# Patient Record
Sex: Female | Born: 1945
Health system: Southern US, Community
[De-identification: ages and names within clinical notes are randomized; demographics above are authoritative.]

## PROBLEM LIST (undated history)

## (undated) DIAGNOSIS — R251 Tremor, unspecified: Secondary | ICD-10-CM

## (undated) DIAGNOSIS — D649 Anemia, unspecified: Secondary | ICD-10-CM

## (undated) DIAGNOSIS — Z973 Presence of spectacles and contact lenses: Secondary | ICD-10-CM

## (undated) DIAGNOSIS — J189 Pneumonia, unspecified organism: Secondary | ICD-10-CM

## (undated) DIAGNOSIS — H269 Unspecified cataract: Secondary | ICD-10-CM

## (undated) DIAGNOSIS — C801 Malignant (primary) neoplasm, unspecified: Secondary | ICD-10-CM

## (undated) DIAGNOSIS — M1990 Unspecified osteoarthritis, unspecified site: Secondary | ICD-10-CM

## (undated) DIAGNOSIS — F419 Anxiety disorder, unspecified: Secondary | ICD-10-CM

## (undated) DIAGNOSIS — T7840XA Allergy, unspecified, initial encounter: Secondary | ICD-10-CM

## (undated) DIAGNOSIS — C4401 Basal cell carcinoma of skin of lip: Secondary | ICD-10-CM

## (undated) HISTORY — PX: TUBAL LIGATION: SHX77

## (undated) HISTORY — DX: Anemia, unspecified: D64.9

## (undated) HISTORY — PX: DILATION AND CURETTAGE OF UTERUS: SHX78

## (undated) HISTORY — PX: SOFT TISSUE MASS EXCISION: SHX2419

## (undated) HISTORY — PX: ABDOMINAL HYSTERECTOMY: SHX81

## (undated) HISTORY — PX: APPENDECTOMY: SHX54

## (undated) HISTORY — DX: Basal cell carcinoma of skin of lip: C44.01

---

## 1999-09-05 ENCOUNTER — Encounter: Payer: Self-pay | Admitting: *Deleted

## 1999-09-05 ENCOUNTER — Encounter: Admission: RE | Admit: 1999-09-05 | Discharge: 1999-09-05 | Payer: Self-pay | Admitting: *Deleted

## 2000-09-06 ENCOUNTER — Encounter: Payer: Self-pay | Admitting: *Deleted

## 2000-09-06 ENCOUNTER — Encounter: Admission: RE | Admit: 2000-09-06 | Discharge: 2000-09-06 | Payer: Self-pay | Admitting: *Deleted

## 2001-09-20 ENCOUNTER — Encounter: Payer: Self-pay | Admitting: Internal Medicine

## 2001-09-20 ENCOUNTER — Encounter: Admission: RE | Admit: 2001-09-20 | Discharge: 2001-09-20 | Payer: Self-pay | Admitting: Internal Medicine

## 2002-11-09 ENCOUNTER — Encounter: Admission: RE | Admit: 2002-11-09 | Discharge: 2002-11-09 | Payer: Self-pay | Admitting: Internal Medicine

## 2002-11-09 ENCOUNTER — Encounter: Payer: Self-pay | Admitting: Internal Medicine

## 2003-11-30 ENCOUNTER — Encounter: Admission: RE | Admit: 2003-11-30 | Discharge: 2003-11-30 | Payer: Self-pay | Admitting: Internal Medicine

## 2004-12-11 ENCOUNTER — Encounter: Admission: RE | Admit: 2004-12-11 | Discharge: 2004-12-11 | Payer: Self-pay | Admitting: Internal Medicine

## 2005-02-27 ENCOUNTER — Encounter: Admission: RE | Admit: 2005-02-27 | Discharge: 2005-02-27 | Payer: Self-pay | Admitting: Internal Medicine

## 2005-12-18 ENCOUNTER — Encounter: Admission: RE | Admit: 2005-12-18 | Discharge: 2005-12-18 | Payer: Self-pay | Admitting: Internal Medicine

## 2007-01-18 ENCOUNTER — Encounter: Admission: RE | Admit: 2007-01-18 | Discharge: 2007-01-18 | Payer: Self-pay | Admitting: Internal Medicine

## 2008-01-20 ENCOUNTER — Encounter: Admission: RE | Admit: 2008-01-20 | Discharge: 2008-01-20 | Payer: Self-pay | Admitting: Gynecology

## 2009-01-22 ENCOUNTER — Encounter: Admission: RE | Admit: 2009-01-22 | Discharge: 2009-01-22 | Payer: Self-pay | Admitting: Internal Medicine

## 2010-01-01 ENCOUNTER — Encounter: Admission: RE | Admit: 2010-01-01 | Discharge: 2010-01-01 | Payer: Self-pay | Admitting: Internal Medicine

## 2010-01-17 ENCOUNTER — Encounter: Admission: RE | Admit: 2010-01-17 | Discharge: 2010-01-17 | Payer: Self-pay | Admitting: Internal Medicine

## 2010-02-04 ENCOUNTER — Encounter: Admission: RE | Admit: 2010-02-04 | Discharge: 2010-02-04 | Payer: Self-pay | Admitting: Internal Medicine

## 2010-02-24 ENCOUNTER — Encounter: Admission: RE | Admit: 2010-02-24 | Discharge: 2010-02-24 | Payer: Self-pay | Admitting: Internal Medicine

## 2010-04-10 ENCOUNTER — Ambulatory Visit (HOSPITAL_COMMUNITY): Admission: RE | Admit: 2010-04-10 | Discharge: 2010-04-10 | Payer: Self-pay | Admitting: Internal Medicine

## 2010-12-21 LAB — CBC
HCT: 44.6 % (ref 36.0–46.0)
MCH: 32.6 pg (ref 26.0–34.0)
MCHC: 33.9 g/dL (ref 30.0–36.0)
MCV: 96.1 fL (ref 78.0–100.0)
RBC: 4.64 MIL/uL (ref 3.87–5.11)

## 2010-12-21 LAB — CULTURE, ROUTINE-ABSCESS: Culture: NO GROWTH

## 2010-12-21 LAB — PROTIME-INR
INR: 0.9 (ref 0.00–1.49)
Prothrombin Time: 12.1 seconds (ref 11.6–15.2)

## 2010-12-21 LAB — APTT: aPTT: 26 seconds (ref 24–37)

## 2011-03-11 ENCOUNTER — Other Ambulatory Visit: Payer: Self-pay | Admitting: Internal Medicine

## 2011-03-11 DIAGNOSIS — Z1231 Encounter for screening mammogram for malignant neoplasm of breast: Secondary | ICD-10-CM

## 2011-03-24 ENCOUNTER — Ambulatory Visit
Admission: RE | Admit: 2011-03-24 | Discharge: 2011-03-24 | Disposition: A | Discharge status: Home / Self Care | Payer: Medicare Other | Source: Ambulatory Visit | Attending: Internal Medicine | Admitting: Internal Medicine

## 2011-03-24 DIAGNOSIS — Z1231 Encounter for screening mammogram for malignant neoplasm of breast: Secondary | ICD-10-CM

## 2011-09-25 ENCOUNTER — Other Ambulatory Visit: Payer: Self-pay | Admitting: Internal Medicine

## 2011-09-25 DIAGNOSIS — R3129 Other microscopic hematuria: Secondary | ICD-10-CM

## 2011-10-01 ENCOUNTER — Other Ambulatory Visit: Payer: Medicare Other

## 2011-10-08 ENCOUNTER — Ambulatory Visit
Admission: RE | Admit: 2011-10-08 | Discharge: 2011-10-08 | Disposition: A | Discharge status: Home / Self Care | Payer: Medicare Other | Source: Ambulatory Visit | Attending: Internal Medicine | Admitting: Internal Medicine

## 2011-10-08 DIAGNOSIS — R3129 Other microscopic hematuria: Secondary | ICD-10-CM

## 2012-03-22 ENCOUNTER — Other Ambulatory Visit: Payer: Self-pay | Admitting: Internal Medicine

## 2012-03-22 DIAGNOSIS — Z1231 Encounter for screening mammogram for malignant neoplasm of breast: Secondary | ICD-10-CM

## 2012-04-04 ENCOUNTER — Ambulatory Visit: Payer: Medicare Other

## 2012-04-11 ENCOUNTER — Ambulatory Visit
Admission: RE | Admit: 2012-04-11 | Discharge: 2012-04-11 | Disposition: A | Discharge status: Home / Self Care | Payer: Medicare Other | Source: Ambulatory Visit | Attending: Internal Medicine | Admitting: Internal Medicine

## 2012-04-11 DIAGNOSIS — Z1231 Encounter for screening mammogram for malignant neoplasm of breast: Secondary | ICD-10-CM

## 2012-10-05 ENCOUNTER — Observation Stay (HOSPITAL_COMMUNITY)
Admission: EM | Admit: 2012-10-05 | Discharge: 2012-10-07 | Disposition: A | Discharge status: Home / Self Care | Payer: Medicare Other | Attending: Internal Medicine | Admitting: Internal Medicine

## 2012-10-05 ENCOUNTER — Encounter (HOSPITAL_COMMUNITY): Payer: Self-pay | Admitting: *Deleted

## 2012-10-05 ENCOUNTER — Emergency Department (HOSPITAL_COMMUNITY)
Admission: EM | Admit: 2012-10-05 | Discharge: 2012-10-05 | Disposition: A | Discharge status: Nursing Home (Includes ICF) | Payer: Medicare Other | Source: Home / Self Care | Attending: Family Medicine | Admitting: Family Medicine

## 2012-10-05 ENCOUNTER — Emergency Department (INDEPENDENT_AMBULATORY_CARE_PROVIDER_SITE_OTHER): Payer: Medicare Other

## 2012-10-05 DIAGNOSIS — R509 Fever, unspecified: Secondary | ICD-10-CM | POA: Insufficient documentation

## 2012-10-05 DIAGNOSIS — J189 Pneumonia, unspecified organism: Secondary | ICD-10-CM

## 2012-10-05 DIAGNOSIS — R059 Cough, unspecified: Secondary | ICD-10-CM | POA: Insufficient documentation

## 2012-10-05 DIAGNOSIS — R0602 Shortness of breath: Secondary | ICD-10-CM | POA: Insufficient documentation

## 2012-10-05 DIAGNOSIS — IMO0001 Reserved for inherently not codable concepts without codable children: Secondary | ICD-10-CM | POA: Insufficient documentation

## 2012-10-05 DIAGNOSIS — R05 Cough: Secondary | ICD-10-CM | POA: Insufficient documentation

## 2012-10-05 HISTORY — DX: Pneumonia, unspecified organism: J18.9

## 2012-10-05 LAB — COMPREHENSIVE METABOLIC PANEL
ALT: 24 U/L (ref 0–35)
AST: 18 U/L (ref 0–37)
Albumin: 3.2 g/dL — ABNORMAL LOW (ref 3.5–5.2)
CO2: 23 mEq/L (ref 19–32)
Calcium: 9.3 mg/dL (ref 8.4–10.5)
Creatinine, Ser: 0.5 mg/dL (ref 0.50–1.10)
GFR calc non Af Amer: >90 mL/min (ref >90)
Sodium: 139 mEq/L (ref 135–145)

## 2012-10-05 LAB — CBC WITH DIFFERENTIAL/PLATELET
Basophils Absolute: 0 10*3/uL (ref 0.0–0.1)
Basophils Relative: 0 % (ref 0–1)
Eosinophils Relative: 0 % (ref 0–5)
Lymphocytes Relative: 10 % — ABNORMAL LOW (ref 12–46)
MCHC: 34 g/dL (ref 30.0–36.0)
MCV: 91.2 fL (ref 78.0–100.0)
Neutro Abs: 5.3 10*3/uL (ref 1.7–7.7)
Platelets: 248 10*3/uL (ref 150–400)
RDW: 13.3 % (ref 11.5–15.5)
WBC: 6.4 10*3/uL (ref 4.0–10.5)

## 2012-10-05 MED ORDER — ONDANSETRON 4 MG PO TBDP
ORAL_TABLET | ORAL | Status: AC
  Filled 2012-10-05: qty 1

## 2012-10-05 MED ORDER — ZOLPIDEM TARTRATE 5 MG PO TABS
5.0000 mg | ORAL_TABLET | Freq: Every evening | ORAL | Status: DC | PRN
  Administered 2012-10-05 – 2012-10-06 (×2): 5 mg via ORAL
  Filled 2012-10-05 (×2): qty 1

## 2012-10-05 MED ORDER — ACETAMINOPHEN 325 MG PO TABS
ORAL_TABLET | ORAL | Status: AC
  Filled 2012-10-05: qty 2

## 2012-10-05 MED ORDER — ONDANSETRON HCL 4 MG PO TABS: 4.0000 mg | ORAL_TABLET | Freq: Four times a day (QID) | ORAL | Status: DC | PRN

## 2012-10-05 MED ORDER — BENZONATATE 100 MG PO CAPS
100.0000 mg | ORAL_CAPSULE | Freq: Three times a day (TID) | ORAL | Status: DC | PRN
  Administered 2012-10-05 – 2012-10-07 (×4): 100 mg via ORAL
  Filled 2012-10-05 (×4): qty 1

## 2012-10-05 MED ORDER — ONDANSETRON HCL 4 MG/2ML IJ SOLN: 4.0000 mg | Freq: Three times a day (TID) | INTRAMUSCULAR | Status: AC | PRN

## 2012-10-05 MED ORDER — ACETAMINOPHEN 325 MG PO TABS
ORAL_TABLET | ORAL | Status: AC
  Filled 2012-10-05: qty 1

## 2012-10-05 MED ORDER — ACETAMINOPHEN 325 MG PO TABS
650.0000 mg | ORAL_TABLET | Freq: Four times a day (QID) | ORAL | Status: DC | PRN
  Administered 2012-10-05 – 2012-10-07 (×5): 650 mg via ORAL
  Filled 2012-10-05 (×5): qty 2

## 2012-10-05 MED ORDER — ENOXAPARIN SODIUM 40 MG/0.4ML ~~LOC~~ SOLN
40.0000 mg | SUBCUTANEOUS | Status: DC
  Filled 2012-10-05 (×3): qty 0.4

## 2012-10-05 MED ORDER — DEXTROSE 5 % IV SOLN
500.0000 mg | Freq: Once | INTRAVENOUS | Status: DC
  Filled 2012-10-05: qty 500

## 2012-10-05 MED ORDER — SODIUM CHLORIDE 0.9 % IV SOLN
INTRAVENOUS | Status: AC
  Administered 2012-10-05: 16:00:00 via INTRAVENOUS

## 2012-10-05 MED ORDER — ONDANSETRON HCL 4 MG/2ML IJ SOLN
4.0000 mg | Freq: Four times a day (QID) | INTRAMUSCULAR | Status: DC | PRN
  Administered 2012-10-06: 4 mg via INTRAVENOUS
  Filled 2012-10-05: qty 2

## 2012-10-05 MED ORDER — ACETAMINOPHEN 650 MG RE SUPP: 650.0000 mg | Freq: Four times a day (QID) | RECTAL | Status: DC | PRN

## 2012-10-05 MED ORDER — ONDANSETRON 4 MG PO TBDP
4.0000 mg | ORAL_TABLET | Freq: Once | ORAL | Status: AC
Start: 2012-10-05 — End: 2012-10-05
  Administered 2012-10-05: 4 mg via ORAL

## 2012-10-05 MED ORDER — DEXTROSE 5 % IV SOLN
1.0000 g | INTRAVENOUS | Status: DC
  Administered 2012-10-06: 1 g via INTRAVENOUS
  Filled 2012-10-05: qty 10

## 2012-10-05 MED ORDER — DEXTROSE 5 % IV SOLN
1.0000 g | Freq: Once | INTRAVENOUS | Status: AC
  Administered 2012-10-05: 1 g via INTRAVENOUS
  Filled 2012-10-05: qty 10

## 2012-10-05 MED ORDER — ALUM & MAG HYDROXIDE-SIMETH 200-200-20 MG/5ML PO SUSP: 30.0000 mL | Freq: Four times a day (QID) | ORAL | Status: DC | PRN

## 2012-10-05 MED ORDER — AZITHROMYCIN 250 MG PO TABS
250.0000 mg | ORAL_TABLET | Freq: Every day | ORAL | Status: DC
  Administered 2012-10-06: 250 mg via ORAL
  Filled 2012-10-05 (×2): qty 1

## 2012-10-05 MED ORDER — SODIUM CHLORIDE 0.9 % IV SOLN: INTRAVENOUS | Status: DC

## 2012-10-05 MED ORDER — ALBUTEROL SULFATE (5 MG/ML) 0.5% IN NEBU: 2.5000 mg | INHALATION_SOLUTION | RESPIRATORY_TRACT | Status: AC | PRN

## 2012-10-05 MED ORDER — ACETAMINOPHEN 500 MG PO TABS
1000.0000 mg | ORAL_TABLET | Freq: Four times a day (QID) | ORAL | Status: DC | PRN
  Administered 2012-10-05: 1000 mg via ORAL

## 2012-10-05 NOTE — ED Notes (Signed)
Results of lactic acid shown to Dr. Radford Pax

## 2012-10-05 NOTE — H&P (Signed)
Physician Admission History and Physical     PCP:   Gwen Pounds, MD   Chief Complaint:  Pneumonia   HPI: Tiffany Franklin is an 67 y.o. female.  Pt presents after having malaise since Christmas and then developing chills on Friday. She is having a cough. Feels like she cant take a deep breath. Did have 1 episode of nausea and emesis yesterday, but possibly from the NSAIDs she has been taking for her pain/discomfort. Found to have PNA on CXR. Being admitted for  antibiotics and fluids. CURB65 was 2, so int risk of complication from the PNA.  Review of Systems:  ROS negative except as noted above.   Past Medical History: Past Medical History  Diagnosis Date  . Pneumonia    Past Surgical History  Procedure Date  . Abdominal hysterectomy     Medications: Prior to Admission medications   Medication Sig Start Date End Date Taking? Authorizing Provider  ALPRAZolam Prudy Feeler) 0.5 MG tablet Take 0.5 mg by mouth at bedtime as needed.   Yes Historical Provider, MD  Ascorbic Acid (VITAMIN C) 100 MG tablet Take 100 mg by mouth daily.   Yes Historical Provider, MD  estradiol (VAGIFEM) 25 MCG vaginal tablet Place 25 mcg vaginally daily.   Yes Historical Provider, MD    Allergies:   Allergies  Allergen Reactions  . Demerol (Meperidine) Nausea And Vomiting and Other (See Comments)    Blood pressure becomes elevated.    Social History:  reports that she has never smoked. She does not have any smokeless tobacco history on file. She reports that she drinks alcohol. Her drug history not on file.  Family History: No family history on file.  Physical Exam: Filed Vitals:   10/05/12 1343 10/05/12 1430 10/05/12 1530  BP: 130/65 114/47 109/53  Pulse: 123 87 89  Temp:  99.3 F (37.4 C)   TempSrc:  Oral   Resp: 24 16 19   SpO2: 95% 95% 95%   General appearance: WF in NAD  Head: Normocephalic, without obvious abnormality, atraumatic Eyes: conjunctivae/corneas clear. PERRL, EOM's intact.   Nose: Nares normal. Septum midline. Mucosa normal. No drainage or sinus tenderness. Throat: lips, mucosa, and tongue normal; teeth and gums normal Neck: no adenopathy, no carotid bruit, no JVD and thyroid not enlarged, symmetric, no tenderness/mass/nodules Resp: good air movement throughout. Rhonchi in the bases.  Cardio: PVCs present, but otherwise RRR and no MRG  GI: soft, non-tender; bowel sounds normal; no masses,  no organomegaly Extremities: extremities normal, atraumatic, no cyanosis or edema Pulses: 2+ and symmetric Lymph nodes: Cervical adenopathy: no cervical lymphadenopathy Neurologic: Alert and oriented X 3, normal strength and tone. Normal symmetric reflexes.     Labs on Admission:   [facility] 10/05/12 1453  NA 139  K 3.5  CL 102  CO2 23  GLUCOSE 122*  BUN 11  CREATININE 0.50  CALCIUM 9.3  MG --  PHOS --    Basename 10/05/12 1453  AST 18  ALT 24  ALKPHOS 157*  BILITOT 0.5  PROT 7.2  ALBUMIN 3.2*   No results found for this basename: LIPASE:2,AMYLASE:2 in the last 72 hours  Basename 10/05/12 1453  WBC 6.4  NEUTROABS 5.3  HGB 12.0  HCT 35.3*  MCV 91.2  PLT 248   No results found for this basename: CKTOTAL:3,CKMB:3,CKMBINDEX:3,TROPONINI:3 in the last 72 hours Lab Results  Component Value Date   INR 0.90 04/10/2010   No results found for this basename: TSH,T4TOTAL,FREET3,T3FREE,THYROIDAB in the last 72 hours  No results found for this basename: VITAMINB12:2,FOLATE:2,FERRITIN:2,TIBC:2,IRON:2,RETICCTPCT:2 in the last 72 hours  Radiological Exams on Admission: Dg Chest 2 View  10/05/2012  *RADIOLOGY REPORT*  Clinical Data: Fever and cough.  Hypoxia.  Shortness of breath.  CHEST - 2 VIEW  Comparison: None.  Findings: Airspace disease is seen bilaterally in the right upper lobe and left lower lung, suspicious for pneumonia.  No evidence of pleural effusion.  Heart size is normal.  No mass or lymphadenopathy identified.  IMPRESSION: Bilateral right upper lobe  and left lower lung airspace disease, consistent with pneumonia. Post-treatment  radiographic followup recommended to confirm resolution.   Original Report Authenticated By: Myles Rosenthal, M.D.    No orders found for this or any previous visit.  Assessment/Plan Principal Problem:  *Pneumonia  - CAP: Azithromycin and rocephin for the first 24-48hr and then if improvement likely can be discharged home on Thurs-Fri w/ avelox.. Blood cultures are pending. NS at 100cc/hr overnight, then likely can d/c in AM and allow oral intake only. She is afebrile w/o leukocytosis. zofran prn. Tessalon prn. Ativan prn.   - elevated alk phos w/ normal other LFTs: likely benign. Will check GGT to see if liver in origin. No elevated calcium. Albumin is low. No RUQ pain.   Admit to OBS. Lovenox for DVT ppx. Normal diet.   Floyd Lusignan 10/05/2012, 3:50 PM

## 2012-10-05 NOTE — ED Provider Notes (Signed)
History     CSN: [id number]  Arrival date & time 10/05/12  1154   First MD Initiated Contact with Patient 10/05/12 1236      Chief Complaint  Patient presents with  . Fever    (Consider location/radiation/quality/duration/timing/severity/associated sxs/prior treatment) Patient is a 67 y.o. female presenting with fever. The history is provided by the patient.  Fever Primary symptoms of the febrile illness include fever, fatigue, cough and shortness of breath. Primary symptoms do not include nausea, vomiting, diarrhea or dysuria. The current episode started 6 to 7 days ago. This is a new problem. The problem has been gradually worsening.    History reviewed. No pertinent past medical history.  Past Surgical History  Procedure Date  . Abdominal hysterectomy     No family history on file.  History  Substance Use Topics  . Smoking status: Never Smoker   . Smokeless tobacco: Not on file  . Alcohol Use: No    OB History    Grav Para Term Preterm Abortions TAB SAB Ect Mult Living                  Review of Systems  Constitutional: Positive for fever, chills and fatigue.  Respiratory: Positive for cough and shortness of breath.   Gastrointestinal: Negative for nausea, vomiting and diarrhea.  Genitourinary: Negative for dysuria.    Allergies  Review of patient's allergies indicates no known allergies.  Home Medications  No current outpatient prescriptions on file.  BP 124/71  Pulse 120  Temp 102.2 F (39 C) (Oral)  Resp 20  SpO2 96%  Physical Exam  Nursing note and vitals reviewed. Constitutional: She is oriented to person, place, and time. She appears well-developed and well-nourished. She appears distressed.  HENT:  Head: Normocephalic.  Right Ear: External ear normal.  Left Ear: External ear normal.  Mouth/Throat: Oropharynx is clear and moist.  Eyes: Pupils are equal, round, and reactive to light.  Neck: Normal range of motion. Neck supple.    Cardiovascular: Normal rate, normal heart sounds and intact distal pulses.   Pulmonary/Chest: She is in respiratory distress. She has rales.  Lymphadenopathy:    She has no cervical adenopathy.  Neurological: She is alert and oriented to person, place, and time.  Skin: Skin is warm and dry.    ED Course  Procedures (including critical care time)  Labs Reviewed - No data to display No results found.   1. CAP (community acquired pneumonia)       MDM  X-rays reviewed and report per radiologist.         Linna Hoff, MD 10/05/12 1308

## 2012-10-05 NOTE — ED Notes (Signed)
Pt sent from ucc with fever, cough with green sputum, sob.  Diagnosed with bilateral pna at urgent care.  Treated for temp 102 with tylenol and zofran for nausea

## 2012-10-05 NOTE — ED Provider Notes (Signed)
History     CSN: [id number]  Arrival date & time 10/05/12  1333   First MD Initiated Contact with Patient 10/05/12 1236      No chief complaint on file.   (Consider location/radiation/quality/duration/timing/severity/associated sxs/prior treatment) Patient is a 67 y.o. female presenting with fever. The history is provided by the patient.  Fever Primary symptoms of the febrile illness include fever, fatigue, cough, shortness of breath and myalgias. Primary symptoms do not include nausea, vomiting, diarrhea, dysuria, altered mental status, arthralgias or rash. The current episode started 6 to 7 days ago. This is a new problem. The problem has been gradually worsening.    Past Medical History  Diagnosis Date  . Pneumonia     Past Surgical History  Procedure Date  . Abdominal hysterectomy     No family history on file.  History  Substance Use Topics  . Smoking status: Never Smoker   . Smokeless tobacco: Not on file  . Alcohol Use: Yes     Comment: occ    OB History    Grav Para Term Preterm Abortions TAB SAB Ect Mult Living                  Review of Systems  Constitutional: Positive for fever, chills and fatigue.  Respiratory: Positive for cough and shortness of breath.   Gastrointestinal: Negative for nausea, vomiting and diarrhea.  Genitourinary: Negative for dysuria.  Musculoskeletal: Positive for myalgias. Negative for arthralgias.  Skin: Negative for rash.  Psychiatric/Behavioral: Negative for altered mental status.    Allergies  Demerol  Home Medications   Current Outpatient Rx  Name  Route  Sig  Dispense  Refill  . ALPRAZOLAM 0.5 MG PO TABS   Oral   Take 0.5 mg by mouth at bedtime as needed.         Marland Kitchen VITAMIN C 100 MG PO TABS   Oral   Take 100 mg by mouth daily.         Marland Kitchen ESTRADIOL 25 MCG VA TABS   Vaginal   Place 25 mcg vaginally daily.           BP 109/53  Pulse 89  Temp 99.3 F (37.4 C) (Oral)  Resp 19  SpO2 95%  Physical  Exam  Nursing note and vitals reviewed. Constitutional: She is oriented to person, place, and time. She appears well-developed and well-nourished. She appears distressed.  HENT:  Head: Normocephalic.  Right Ear: External ear normal.  Left Ear: External ear normal.  Mouth/Throat: Oropharynx is clear and moist.  Eyes: Pupils are equal, round, and reactive to light.  Neck: Normal range of motion. Neck supple.  Cardiovascular: Normal rate, normal heart sounds and intact distal pulses.   Pulmonary/Chest: She is in respiratory distress. She has wheezes.  Abdominal: She exhibits no distension. There is no tenderness. There is no rebound.  Musculoskeletal: Normal range of motion.  Lymphadenopathy:    She has no cervical adenopathy.  Neurological: She is alert and oriented to person, place, and time.  Skin: Skin is warm and dry.    ED Course  Procedures  (including critical care time)   Labs Reviewed  CBC WITH DIFFERENTIAL - Abnormal; Notable for the following:    HCT 35.3 (*)     Neutrophils Relative 83 (*)     Lymphocytes Relative 10 (*)     Lymphs Abs 0.6 (*)     All other components within normal limits  COMPREHENSIVE METABOLIC PANEL - Abnormal; Notable  for the following:    Glucose, Bld 122 (*)     Albumin 3.2 (*)     Alkaline Phosphatase 157 (*)     All other components within normal limits  CG4 I-STAT (LACTIC ACID)  CULTURE, BLOOD (ROUTINE X 2)  CULTURE, BLOOD (ROUTINE X 2)   Dg Chest 2 View  10/05/2012  *RADIOLOGY REPORT*  Clinical Data: Fever and cough.  Hypoxia.  Shortness of breath.  CHEST - 2 VIEW  Comparison: None.  Findings: Airspace disease is seen bilaterally in the right upper lobe and left lower lung, suspicious for pneumonia.  No evidence of pleural effusion.  Heart size is normal.  No mass or lymphadenopathy identified.  IMPRESSION: Bilateral right upper lobe and left lower lung airspace disease, consistent with pneumonia. Post-treatment  radiographic followup  recommended to confirm resolution.   Original Report Authenticated By: Myles Rosenthal, M.D.      1. Community acquired pneumonia       MDM  X-rays reviewed and report per radiologist.          Nelia Shi, MD 10/05/12 [id number]

## 2012-10-05 NOTE — ED Notes (Signed)
Pt  Reports  A   Productive   Cough    With  Chills         And  Fever       X   A  Few  Days     Pt  denys  Any  Urinary  Symptoms

## 2012-10-06 LAB — COMPREHENSIVE METABOLIC PANEL
ALT: 17 U/L (ref 0–35)
Alkaline Phosphatase: 131 U/L — ABNORMAL HIGH (ref 39–117)
BUN: 9 mg/dL (ref 6–23)
CO2: 25 mEq/L (ref 19–32)
Chloride: 104 mEq/L (ref 96–112)
GFR calc Af Amer: >90 mL/min (ref >90)
GFR calc non Af Amer: >90 mL/min (ref >90)
Glucose, Bld: 95 mg/dL (ref 70–99)
Potassium: 3.6 mEq/L (ref 3.5–5.1)
Sodium: 142 mEq/L (ref 135–145)
Total Bilirubin: 0.4 mg/dL (ref 0.3–1.2)
Total Protein: 6.6 g/dL (ref 6.0–8.3)

## 2012-10-06 LAB — CBC
HCT: 34.1 % — ABNORMAL LOW (ref 36.0–46.0)
MCHC: 34 g/dL (ref 30.0–36.0)
Platelets: 251 10*3/uL (ref 150–400)
RDW: 13.5 % (ref 11.5–15.5)
WBC: 6.5 10*3/uL (ref 4.0–10.5)

## 2012-10-06 MED ORDER — PSEUDOEPHEDRINE HCL ER 120 MG PO TB12
120.0000 mg | ORAL_TABLET | Freq: Two times a day (BID) | ORAL | Status: DC | PRN
  Administered 2012-10-06 – 2012-10-07 (×2): 120 mg via ORAL
  Filled 2012-10-06 (×2): qty 1

## 2012-10-06 MED ORDER — PROMETHAZINE-PHENYLEPHRINE 6.25-5 MG/5ML PO SYRP
5.0000 mL | ORAL_SOLUTION | ORAL | Status: DC | PRN
  Filled 2012-10-06: qty 5

## 2012-10-06 MED ORDER — DM-GUAIFENESIN ER 30-600 MG PO TB12
1.0000 | ORAL_TABLET | Freq: Two times a day (BID) | ORAL | Status: DC | PRN
  Administered 2012-10-06 – 2012-10-07 (×2): 1 via ORAL
  Filled 2012-10-06 (×2): qty 1

## 2012-10-06 NOTE — Progress Notes (Signed)
Subjective: Feels ok today.  Just lots of cough and congestion, feels tired.  Did walk in halls with the nurse last night.  Objective: Vital signs in last 24 hours: Temp:  [97.9 F (36.6 C)-102.2 F (39 C)] 97.9 F (36.6 C) (01/02 0535) Pulse Rate:  [77-123] 77  (01/02 0535) Resp:  [16-24] 20  (01/02 0535) BP: (101-130)/(47-71) 114/57 mmHg (01/02 0535) SpO2:  [95 %-100 %] 96 % (01/02 0535) Weight:  [63.504 kg (140 lb)] 63.504 kg (140 lb) (01/01 1900) Weight change:  Last BM Date: 10/04/12  Intake/Output from previous day:   Intake/Output this shift:   General appearance: WF in NAD  Nose: Nares normal. Septum midline.some clear drainage Throat: lips, mucosa, and tongue normal; teeth and gums normal  Neck: no adenopathy, no carotid bruit, no JVD and thyroid not enlarged, symmetric, no tenderness/mass/nodules  Resp: good air movement throughout. Rhonchi in the bases.  Cardio:  RRR and no murmur  GI: soft, non-tender; bowel sounds normal; no masses, no organomegaly  Extremities: extremities normal, atraumatic, no cyanosis or edema  Pulses: 2+ and symmetric    Lab Results:  Basename 10/05/12 1453  WBC 6.4  HGB 12.0  HCT 35.3*  PLT 248   BMET  Basename 10/05/12 1453  NA 139  K 3.5  CL 102  CO2 23  GLUCOSE 122*  BUN 11  CREATININE 0.50  CALCIUM 9.3    Studies/Results: Dg Chest 2 View  10/05/2012  *RADIOLOGY REPORT*  Clinical Data: Fever and cough.  Hypoxia.  Shortness of breath.  CHEST - 2 VIEW  Comparison: None.  Findings: Airspace disease is seen bilaterally in the right upper lobe and left lower lung, suspicious for pneumonia.  No evidence of pleural effusion.  Heart size is normal.  No mass or lymphadenopathy identified.  IMPRESSION: Bilateral right upper lobe and left lower lung airspace disease, consistent with pneumonia. Post-treatment  radiographic followup recommended to confirm resolution.   Original Report Authenticated By: Myles Rosenthal, M.D.      Medications:  I have reviewed the patient's current medications. Scheduled:   . azithromycin  250 mg Oral q1800  . cefTRIAXone (ROCEPHIN)  IV  1 g Intravenous Q24H  . enoxaparin (LOVENOX) injection  40 mg Subcutaneous Q24H   Continuous:  XBJ:[id number], acetaminophen, alum & mag hydroxide-simeth, benzonatate, ondansetron (ZOFRAN) IV, ondansetron, promethazine-phenylephrine, zolpidem  Assessment/Plan: Principal Problem:  *Pneumonia  - CAP: Azithromycin and rocephin for the first 24-48hr and then if improvement likely can be discharged home on Thurs-Fri w/ Zithromax and Ceftin to complete course,.   Blood cultures are pending. Phenylephrine/promethazine for congestion and cough. Continue PO fluids- she is to drink 1 glass of water every hour until her urine is clear and then drink for thirst. Likely home in AM if remains afebrile and no worsening in respiratory status.  LOS: 1 day   Tiffany Franklin W 10/06/2012, 8:21 AM

## 2012-10-07 ENCOUNTER — Observation Stay (HOSPITAL_COMMUNITY): Payer: Medicare Other

## 2012-10-07 LAB — COMPREHENSIVE METABOLIC PANEL
ALT: 18 U/L (ref 0–35)
Albumin: 3.1 g/dL — ABNORMAL LOW (ref 3.5–5.2)
Alkaline Phosphatase: 140 U/L — ABNORMAL HIGH (ref 39–117)
Glucose, Bld: 101 mg/dL — ABNORMAL HIGH (ref 70–99)
Potassium: 3.1 mEq/L — ABNORMAL LOW (ref 3.5–5.1)
Sodium: 140 mEq/L (ref 135–145)
Total Protein: 7.4 g/dL (ref 6.0–8.3)

## 2012-10-07 LAB — CBC
HCT: 37.1 % (ref 36.0–46.0)
MCH: 31.4 pg (ref 26.0–34.0)
MCHC: 34.5 g/dL (ref 30.0–36.0)
MCV: 90.9 fL (ref 78.0–100.0)
RDW: 13 % (ref 11.5–15.5)

## 2012-10-07 MED ORDER — ACETAMINOPHEN 325 MG PO TABS: 650.0000 mg | ORAL_TABLET | Freq: Four times a day (QID) | ORAL | Status: DC | PRN

## 2012-10-07 MED ORDER — ALIGN 4 MG PO CAPS: 4.0000 mg | ORAL_CAPSULE | Freq: Every day | ORAL | Status: DC

## 2012-10-07 MED ORDER — POTASSIUM CHLORIDE CRYS ER 20 MEQ PO TBCR
40.0000 meq | EXTENDED_RELEASE_TABLET | Freq: Once | ORAL | Status: AC
  Administered 2012-10-07: 40 meq via ORAL
  Filled 2012-10-07: qty 2

## 2012-10-07 MED ORDER — BENZONATATE 100 MG PO CAPS: 100.0000 mg | ORAL_CAPSULE | Freq: Three times a day (TID) | ORAL | Status: DC | PRN

## 2012-10-07 MED ORDER — FLUTICASONE-SALMETEROL 250-50 MCG/DOSE IN AEPB: 1.0000 | INHALATION_SPRAY | Freq: Two times a day (BID) | RESPIRATORY_TRACT | Status: DC

## 2012-10-07 MED ORDER — DM-GUAIFENESIN ER 30-600 MG PO TB12: 1.0000 | ORAL_TABLET | Freq: Two times a day (BID) | ORAL | Status: DC | PRN

## 2012-10-07 MED ORDER — IPRATROPIUM BROMIDE 0.02 % IN SOLN
0.5000 mg | Freq: Once | RESPIRATORY_TRACT | Status: AC
  Administered 2012-10-07: 0.5 mg via RESPIRATORY_TRACT
  Filled 2012-10-07: qty 2.5

## 2012-10-07 MED ORDER — LEVOFLOXACIN 750 MG PO TABS: 750.0000 mg | ORAL_TABLET | ORAL | Status: DC

## 2012-10-07 MED ORDER — LEVOFLOXACIN 750 MG PO TABS
750.0000 mg | ORAL_TABLET | ORAL | Status: DC
  Administered 2012-10-07: 750 mg via ORAL
  Filled 2012-10-07 (×2): qty 1

## 2012-10-07 MED ORDER — PSEUDOEPHEDRINE HCL ER 120 MG PO TB12: 120.0000 mg | ORAL_TABLET | Freq: Two times a day (BID) | ORAL | Status: DC | PRN

## 2012-10-07 NOTE — Discharge Summary (Signed)
Physician Discharge Summary  DISCHARGE SUMMARY   Patient ID: Tiffany Franklin MR#: [id number] DOB/AGE: Sep 22, 1946 67 y.o.   Attending Physician:Sasan Wilkie M  Patient's WUJ:WJXBJ,YNWG M, MD  Consults: none  Admit date: 10/05/2012 Discharge date: 10/07/2012  Discharge Diagnoses:  Principal Problem:  *Pneumonia   Patient Active Problem List  Diagnosis  . Pneumonia   Past Medical History  Diagnosis Date  . Pneumonia     Discharged Condition: stable   Discharge Medications:   Medication List     As of 10/07/2012 12:50 PM    TAKE these medications         acetaminophen 325 MG tablet   Commonly known as: TYLENOL   Take 2 tablets (650 mg total) by mouth every 6 (six) hours as needed (or Fever >/= 101).      ALIGN 4 MG Caps   Take 4 mg by mouth daily.      ALPRAZolam 0.5 MG tablet   Commonly known as: XANAX   Take 0.5 mg by mouth at bedtime as needed.      benzonatate 100 MG capsule   Commonly known as: TESSALON   Take 1 capsule (100 mg total) by mouth 3 (three) times daily as needed for cough.      dextromethorphan-guaiFENesin 30-600 MG per 12 hr tablet   Commonly known as: MUCINEX DM   Take 1 tablet by mouth 2 (two) times daily as needed.      estradiol 25 MCG vaginal tablet   Commonly known as: VAGIFEM   Place 25 mcg vaginally daily.      Fluticasone-Salmeterol 250-50 MCG/DOSE Aepb   Commonly known as: ADVAIR   Inhale 1 puff into the lungs 2 (two) times daily.      levofloxacin 750 MG tablet   Commonly known as: LEVAQUIN   Take 1 tablet (750 mg total) by mouth daily.      pseudoephedrine 120 MG 12 hr tablet   Commonly known as: SUDAFED   Take 1 tablet (120 mg total) by mouth every 12 (twelve) hours as needed.      vitamin C 100 MG tablet   Take 100 mg by mouth daily.         Hospital Procedures: Dg Chest 2 View  10/05/2012  *RADIOLOGY REPORT*  Clinical Data: Fever and cough.  Hypoxia.  Shortness of breath.  CHEST - 2 VIEW  Comparison: None.   Findings: Airspace disease is seen bilaterally in the right upper lobe and left lower lung, suspicious for pneumonia.  No evidence of pleural effusion.  Heart size is normal.  No mass or lymphadenopathy identified.  IMPRESSION: Bilateral right upper lobe and left lower lung airspace disease, consistent with pneumonia. Post-treatment  radiographic followup recommended to confirm resolution.   Original Report Authenticated By: Myles Rosenthal, M.D.     History of Present Illness: 67 y.o. Female presented to med attention 10/05/12 after having malaise since Christmas and then developing chills on Friday. She was admitted with cough congestion, chills and SOB.  She did have 1 episode of nausea and emesis. In the ED she was found  to have PNA on CXR. She was admitted for antibiotics and fluids.    Hospital Course: She was admitted 10/05/12 with Bilateral CAP. She was treated with IV Fluids, Oral Sx relief meds, and IV Abx She improved over the two days she was here. She transitioned to oral Abx on 10/07/12 She was feeling much better than D/c. Her Labs were fine. CXR still c/w  PNA. Sats are fine. I will add more pulmonary toilet. She is less SOB Still coughing and getting stuff up. No wheezing to currently justify Pred taper.  She was seen on am rounds 10/07/12 and her Azithromycin and rocephin were switched to PO levaquin. Will attempt a little more pulmonary toilet, D/Ced PIV and waited to D/C home post lunch.  I reviewed CXR with her.  Blood cultures NGTD.  She has been up walking.  She states there is nothing being done in hospital that she cannot do at home on her own.  Phenylephrine/promethazine for congestion and cough.  She remains afebrile and no worsening in respiratory status.  S/P replacement of K.  Alk Phos was high.  GGT was Normal.  Will follow as outpt as level was 131 - 157  The Breathing treatment may have helped a little at best. She feels well enough for D/c She had a fatigue sinking  spell @ 10 am and now better.    Day of Discharge Exam BP 117/67  Pulse 98  Temp 98 F (36.7 C) (Oral)  Resp 20  Ht 5\' 6"  (1.676 m)  Wt 63.504 kg (140 lb)  BMI 22.60 kg/m2  SpO2 96%  PE - See PN today  Discharge Labs:  [facility] 10/07/12 0650 10/06/12 0746  NA 140 142  K 3.1* 3.6  CL 100 104  CO2 24 25  GLUCOSE 101* 95  BUN 9 9  CREATININE 0.44* 0.47*  CALCIUM 9.5 9.1  MG -- --  PHOS -- --    Basename 10/07/12 0650 10/06/12 0746  AST 17 13  ALT 18 17  ALKPHOS 140* 131*  BILITOT 0.3 0.4  PROT 7.4 6.6  ALBUMIN 3.1* 2.8*    Basename 10/07/12 0650 10/06/12 0746 10/05/12 1453  WBC 9.1 6.5 --  NEUTROABS -- -- 5.3  HGB 12.8 11.6* --  HCT 37.1 34.1* --  MCV 90.9 91.2 --  PLT 292 251 --   No results found for this basename: CKTOTAL:3,CKMB:3,CKMBINDEX:3,TROPONINI:3 in the last 72 hours No results found for this basename: TSH,T4TOTAL,FREET3,T3FREE,THYROIDAB in the last 72 hours No results found for this basename: VITAMINB12:2,FOLATE:2,FERRITIN:2,TIBC:2,IRON:2,RETICCTPCT:2 in the last 72 hours Lab Results  Component Value Date   INR 0.90 04/10/2010       Discharge instructions:  04-Intermediate Care Facility Follow-up Information    Follow up with Gwen Pounds, MD. In 7 days.   Contact information:   [address] [facility] [place] [address] [phone]           Disposition: home  Follow-up Appts: Follow-up with Dr. Timothy Lasso at [facility] in 1 week.  Call for appointment.  Condition on Discharge: stable  Tests Needing Follow-up: CXR and labs  Time spent in discharge (includes decision making & examination of pt): 30 min  Signed: Michaelle Bottomley M 10/07/2012, 12:50 PM

## 2012-10-07 NOTE — Progress Notes (Signed)
Subjective: Feels ok today.  Still coughing and congested. Sitting in chair. Feeling better. Tired.   No more chills. Sweating but no fever.  Objective: Vital signs in last 24 hours: Temp:  [97.6 F (36.4 C)-98 F (36.7 C)] 98 F (36.7 C) (01/03 0534) Pulse Rate:  [76-98] 98  (01/03 0534) Resp:  [18-20] 20  (01/03 0534) BP: (117-135)/(61-67) 117/67 mmHg (01/03 0534) SpO2:  [95 %-96 %] 96 % (01/03 0534) Weight change:  Last BM Date: 10/04/12   General appearance: WF in NAD  Nose: Nares normal. Septum midline.some clear drainage Throat: lips, mucosa, and tongue normal; teeth and gums normal  Neck: no adenopathy, no carotid bruit, no JVD and thyroid not enlarged, symmetric, no tenderness/mass/nodules  Resp: good air movement throughout. Rhonchi in the bases.  Cardio:  RRR and no murmur  GI: soft, non-tender; bowel sounds normal; no masses, no organomegaly  Extremities: extremities normal, atraumatic, no cyanosis or edema  Pulses: 2+ and symmetric    Lab Results:  Basename 10/07/12 0650 10/06/12 0746  WBC 9.1 6.5  HGB 12.8 11.6*  HCT 37.1 34.1*  PLT 292 251   BMET  Basename 10/06/12 0746 10/05/12 1453  NA 142 139  K 3.6 3.5  CL 104 102  CO2 25 23  GLUCOSE 95 122*  BUN 9 11  CREATININE 0.47* 0.50  CALCIUM 9.1 9.3    Studies/Results: Dg Chest 2 View  10/05/2012  *RADIOLOGY REPORT*  Clinical Data: Fever and cough.  Hypoxia.  Shortness of breath.  CHEST - 2 VIEW  Comparison: None.  Findings: Airspace disease is seen bilaterally in the right upper lobe and left lower lung, suspicious for pneumonia.  No evidence of pleural effusion.  Heart size is normal.  No mass or lymphadenopathy identified.  IMPRESSION: Bilateral right upper lobe and left lower lung airspace disease, consistent with pneumonia. Post-treatment  radiographic followup recommended to confirm resolution.   Original Report Authenticated By: Myles Rosenthal, M.D.     Medications:  I have reviewed the patient's  current medications.   Assessment/Plan: Principal Problem:  *Pneumonia  - CAP: Azithromycin and rocephin will be D/ced and switched to levaquin.  Will attempt a little more pulmonary toilet. D/C PIV. Anticipate home post lunch Labs Normal. I reviewed CXR with her. Blood cultures NGTD Phenylephrine/promethazine for congestion and cough. She remains afebrile and no worsening in respiratory status.  LOS: 2 days   Timarie Labell M 10/07/2012, 7:35 AM

## 2012-10-11 LAB — CULTURE, BLOOD (ROUTINE X 2)

## 2013-04-24 ENCOUNTER — Other Ambulatory Visit: Payer: Self-pay

## 2013-04-24 DIAGNOSIS — Z1231 Encounter for screening mammogram for malignant neoplasm of breast: Secondary | ICD-10-CM

## 2013-05-18 ENCOUNTER — Ambulatory Visit
Admission: RE | Admit: 2013-05-18 | Discharge: 2013-05-18 | Disposition: A | Discharge status: Home / Self Care | Payer: Medicare Other | Source: Ambulatory Visit

## 2013-05-18 DIAGNOSIS — Z1231 Encounter for screening mammogram for malignant neoplasm of breast: Secondary | ICD-10-CM

## 2014-09-18 ENCOUNTER — Other Ambulatory Visit: Payer: Self-pay

## 2014-09-18 DIAGNOSIS — Z1231 Encounter for screening mammogram for malignant neoplasm of breast: Secondary | ICD-10-CM

## 2014-10-09 ENCOUNTER — Ambulatory Visit
Admission: RE | Admit: 2014-10-09 | Discharge: 2014-10-09 | Disposition: A | Discharge status: Home / Self Care | Payer: PRIVATE HEALTH INSURANCE | Source: Ambulatory Visit

## 2014-10-09 DIAGNOSIS — Z1231 Encounter for screening mammogram for malignant neoplasm of breast: Secondary | ICD-10-CM

## 2014-10-11 ENCOUNTER — Other Ambulatory Visit: Payer: Self-pay | Admitting: Internal Medicine

## 2014-10-11 DIAGNOSIS — R928 Other abnormal and inconclusive findings on diagnostic imaging of breast: Secondary | ICD-10-CM

## 2014-10-19 ENCOUNTER — Ambulatory Visit
Admission: RE | Admit: 2014-10-19 | Discharge: 2014-10-19 | Disposition: A | Discharge status: Home / Self Care | Payer: PPO | Source: Ambulatory Visit | Attending: Internal Medicine | Admitting: Internal Medicine

## 2014-10-19 ENCOUNTER — Other Ambulatory Visit: Payer: Self-pay | Admitting: Internal Medicine

## 2014-10-19 DIAGNOSIS — R928 Other abnormal and inconclusive findings on diagnostic imaging of breast: Secondary | ICD-10-CM

## 2014-10-25 ENCOUNTER — Other Ambulatory Visit: Payer: Self-pay | Admitting: Internal Medicine

## 2014-10-25 DIAGNOSIS — R928 Other abnormal and inconclusive findings on diagnostic imaging of breast: Secondary | ICD-10-CM

## 2014-10-30 ENCOUNTER — Other Ambulatory Visit: Payer: Self-pay | Admitting: Internal Medicine

## 2014-10-30 ENCOUNTER — Ambulatory Visit
Admission: RE | Admit: 2014-10-30 | Discharge: 2014-10-30 | Disposition: A | Discharge status: Home / Self Care | Payer: PPO | Source: Ambulatory Visit | Attending: Internal Medicine | Admitting: Internal Medicine

## 2014-10-30 DIAGNOSIS — R928 Other abnormal and inconclusive findings on diagnostic imaging of breast: Secondary | ICD-10-CM

## 2014-11-05 ENCOUNTER — Ambulatory Visit
Admission: RE | Admit: 2014-11-05 | Discharge: 2014-11-05 | Disposition: A | Discharge status: Home / Self Care | Payer: PPO | Source: Ambulatory Visit | Attending: Internal Medicine | Admitting: Internal Medicine

## 2014-11-05 DIAGNOSIS — R928 Other abnormal and inconclusive findings on diagnostic imaging of breast: Secondary | ICD-10-CM

## 2015-03-11 ENCOUNTER — Other Ambulatory Visit: Payer: Self-pay | Admitting: Internal Medicine

## 2015-03-11 DIAGNOSIS — N6320 Unspecified lump in the left breast, unspecified quadrant: Secondary | ICD-10-CM

## 2015-04-22 ENCOUNTER — Other Ambulatory Visit: Payer: PPO

## 2015-04-24 ENCOUNTER — Ambulatory Visit
Admission: RE | Admit: 2015-04-24 | Discharge: 2015-04-24 | Disposition: A | Discharge status: Home / Self Care | Payer: PPO | Source: Ambulatory Visit | Attending: Internal Medicine | Admitting: Internal Medicine

## 2015-04-24 DIAGNOSIS — N6320 Unspecified lump in the left breast, unspecified quadrant: Secondary | ICD-10-CM

## 2015-05-16 ENCOUNTER — Other Ambulatory Visit: Payer: Self-pay

## 2015-05-24 ENCOUNTER — Other Ambulatory Visit: Payer: Self-pay | Admitting: General Surgery

## 2015-05-24 DIAGNOSIS — R2242 Localized swelling, mass and lump, left lower limb: Secondary | ICD-10-CM

## 2015-06-03 ENCOUNTER — Ambulatory Visit
Admission: RE | Admit: 2015-06-03 | Discharge: 2015-06-03 | Disposition: A | Discharge status: Home / Self Care | Payer: PPO | Source: Ambulatory Visit | Attending: General Surgery | Admitting: General Surgery

## 2015-06-03 DIAGNOSIS — R2242 Localized swelling, mass and lump, left lower limb: Secondary | ICD-10-CM

## 2015-06-03 MED ORDER — GADOBENATE DIMEGLUMINE 529 MG/ML IV SOLN
12.0000 mL | Freq: Once | INTRAVENOUS | Status: AC | PRN
  Administered 2015-06-03: 12 mL via INTRAVENOUS

## 2015-06-06 ENCOUNTER — Telehealth: Payer: Self-pay | Admitting: General Surgery

## 2015-06-06 NOTE — Telephone Encounter (Signed)
Called patient to r/s appt to 09/13 with an 2:45 arrival w/Dr. Rolanda Jay

## 2015-06-06 NOTE — Telephone Encounter (Signed)
NEW PATIENT APPT-S/W PATIENT AND GAVE NP APPT FOR 09/06 W/12:45 ARRIVE W/DR. Rolanda Jay

## 2015-06-11 ENCOUNTER — Ambulatory Visit: Payer: PPO | Admitting: General Surgery

## 2015-06-18 ENCOUNTER — Encounter: Payer: Self-pay | Admitting: General Surgery

## 2015-06-18 ENCOUNTER — Other Ambulatory Visit (HOSPITAL_BASED_OUTPATIENT_CLINIC_OR_DEPARTMENT_OTHER): Payer: PPO

## 2015-06-18 ENCOUNTER — Ambulatory Visit (HOSPITAL_BASED_OUTPATIENT_CLINIC_OR_DEPARTMENT_OTHER): Payer: PPO | Admitting: General Surgery

## 2015-06-18 VITALS — BP 136/51 | HR 81 | Temp 98.0°F | Resp 18 | Wt 135.6 lb

## 2015-06-18 DIAGNOSIS — R2242 Localized swelling, mass and lump, left lower limb: Secondary | ICD-10-CM

## 2015-06-18 DIAGNOSIS — M799 Soft tissue disorder, unspecified: Secondary | ICD-10-CM

## 2015-06-18 DIAGNOSIS — M7989 Other specified soft tissue disorders: Secondary | ICD-10-CM

## 2015-06-18 DIAGNOSIS — C50912 Malignant neoplasm of unspecified site of left female breast: Secondary | ICD-10-CM | POA: Insufficient documentation

## 2015-06-18 LAB — PROTIME-INR
INR: 0.9 — ABNORMAL LOW (ref 2.00–3.50)
Protime: 10.8 Seconds (ref 10.6–13.4)

## 2015-06-18 NOTE — Patient Instructions (Signed)
We will get your scheduled for a biopsy with Interventional Radiology at [facility].  You will receive a phone call with the date and time.  After we have a diagnosis, we will arrange for you to meet with Dr. Rolanda Jay in the office to discuss the next steps/plan.  Please call for any questions or concerns.

## 2015-06-18 NOTE — Assessment & Plan Note (Signed)
Assessment: Left proximal medial thigh soft tissue mass, complex solid cystic well-circumscribed with area of heterogeneity. Differential diagnosis would include benign cystic lesion, myxoid soft tissue sarcoma or synovial cell sarcoma.  Plan: 1. Obtain PT INR in preparation for ultrasound-guided Tru-Cut biopsy of left proximal medial thigh soft tissue mass (discussed with Dr. Laurence Ferrari of interventional radiology who will perform the procedure). 2. See the patient back to review pathology. If nondiagnostic or benign then plan to move straight to resection. If malignant staging workup followed by development of treatment plan which may include pre-or postoperative radiation therapy and/or chemotherapy if synovial cell sarcoma.

## 2015-06-18 NOTE — Progress Notes (Signed)
Tiffany Franklin NOTE  Patient Care Team: Shon Baton, MD as PCP - General (Internal Medicine)  CHIEF COMPLAINTS/PURPOSE OF CONSULTATION:    HISTORY OF PRESENTING ILLNESS:  I had the pleasure of meeting Tiffany Franklin today for evaluation of a left proximal thigh soft tissue mass. Her history dates back to January 2011 when she was exercising and a yoga class when she alto fullness in her proximal medial thigh she had no pain associated with that that at that time. She specifically denies any trauma prior to noticing the mass. She states that around that same time she did have a slight pole of that muscle during exercise but never had any persistent pain. She denies any numbness tingling or weakness of the left lower extremity. She saw her physician in 2011 who evaluated the lesion and it was subsequently evaluated by ultrasound and fine-needle aspiration with no evidence of malignancy. She reports that it has steadily increased in size since she first noticed it. She has no pain currently but reports at times it gets in the way and causes her to externally rotate her left lower extremity insert positions. She's had no coldness, or numbness of the left lower extremity. She's had no weakness in the leg and no falls.   MEDICAL HISTORY:  Past Medical History  Diagnosis Date  . Pneumonia     SURGICAL HISTORY: Past Surgical History  Procedure Laterality Date  . Abdominal hysterectomy and removal of one of her ovaries for cystic disease. No history of cancer. The hysterectomy was performed for abnormal bleeding. She does not know which ovary was removed.       SOCIAL HISTORY: Social History: She reports only occasional social tobacco use many years prior. Only social alcohol use. Denies illicit drug use or occupational exposures.    Social History  . Marital Status: Widowed       .    Marland Kitchen     Occupational History  . Not on file.   Social History Main Topics  . Smoking  status: Never Smoker only socially as described above.   . Smokeless tobacco:  none   . Alcohol Use: Yes     Comment: Only on social occasions. No history of heavy use.   . Drug Use: No  . Sexual Activity: Not on file   Other Topics Concern  . Not on file   Social History Narrative    FAMILY HISTORY: Her father was a smoker and died of lung cancer. Mother is deceased with a history of arthritis hypertension and renal insufficiency. She has 5 sisters. All are alive only one has history of cancer and that was for gastric cancer and she is alive. She has children and grandchildren no history of cancer.  ALLERGIES:  is allergic to demerol. denies history of allergies to contrast her contrast agents.  MEDICATIONS:  Current Outpatient Prescriptions  Medication Sig Dispense Refill  . ALPRAZolam (XANAX) 0.5 MG tablet Take 0.5 mg by mouth at bedtime as needed.    . Ascorbic Acid (VITAMIN C) 100 MG tablet Take 100 mg by mouth daily.    . Fluticasone-Salmeterol (ADVAIR DISKUS) 250-50 MCG/DOSE AEPB Inhale 1 puff into the lungs 2 (two) times daily. 60 each 0  . Multiple Vitamin (MULTIVITAMIN) capsule Take by mouth.    Marland Kitchen acetaminophen (TYLENOL) 325 MG tablet Take 2 tablets (650 mg total) by mouth every 6 (six) hours as needed (or Fever >/= 101). (Patient not taking: Reported on 06/18/2015)    .  fluorouracil (EFUDEX) 5 % cream APPLY TWICE DAILY TO AFFECTED SUN DAMAGED AREAS UP TO 3 WEEKS AS TOLERATED  2  . Probiotic Product (ALIGN) 4 MG CAPS Take 4 mg by mouth daily. (Patient not taking: Reported on 06/18/2015) 14 capsule 0  . zolpidem (AMBIEN) 5 MG tablet Take 5 mg by mouth at bedtime as needed.  3   No current facility-administered medications for this visit.    REVIEW OF SYSTEMS:   Constitutional: Denies fevers, chills or abnormal night sweats Eyes: Denies blurriness of vision, double vision or watery eyes Ears, nose, mouth, throat, and face: Denies mucositis or sore throat Respiratory: Denies  cough, dyspnea or wheezes Cardiovascular: Denies palpitation, chest discomfort or lower extremity swelling Gastrointestinal:  Denies nausea, heartburn or change in bowel habits Skin: Denies abnormal skin rashes Lymphatics: Denies new lymphadenopathy or easy bruising Neurological:Denies numbness, tingling or new weaknesses Behavioral/Psych: Mood is stable, no new changes  All other systems were reviewed with the patient and are negative.  PHYSICAL EXAMINATION: ECOG PERFORMANCE STATUS: 0 - Asymptomatic  Filed Vitals:   06/18/15 1435  BP: 136/51  Pulse: 81  Temp: 98 F (36.7 C)  Resp: 18   Filed Weights   06/18/15 1435  Weight: 135 lb 9.6 oz (61.508 kg)   Examination performed with a female chaperone in the room. GENERAL:alert, no distress and comfortable SKIN: skin color, texture, turgor are normal, no rashes or significant lesions EYES: normal, conjunctiva are pink and non-injected, sclera clear OROPHARYNX:no exudate, no erythema and lips, buccal mucosa, and tongue normal  NECK: supple, no JVD, supraclavicular or cervical adenopathy. LYMPH:  no palpable lymphadenopathy in the cervical, axillary or inguinal. Specific attention given to the left inguinal region and there were no palpable lymph nodes. LUNGS: clear to auscultation and percussion with normal breathing effort HEART: regular rate & rhythm and no murmurs and no lower extremity edema ABDOMEN:abdomen soft, non-tender and normal bowel sounds Musculoskeletal: There is an obvious mass located in the proximal medial anterior thigh measuring 10.5 x 9.5 cm. It is well-circumscribed firm and minimally mobile. There are no overlying skin changes and it is nontender to palpation. She has full range of motion in bilateral lower extremities with 5 over 5 strength throughout. She has normal sensation throughout her lower extremities and no evidence of lower extremity swelling and however femoral, dorsalis pedis and posterior tibial  arteries have 2+ pulsation. PSYCH: alert & oriented x 3 with fluent speech NEURO: no focal motor/sensory deficits. Nonlateralizing exam.  LABORATORY DATA:  I have reviewed the data as listed Lab Results  Component Value Date   WBC 9.1 10/07/2012   HGB 12.8 10/07/2012   HCT 37.1 10/07/2012   MCV 90.9 10/07/2012   PLT 292 10/07/2012   Lab Results  Component Value Date   NA 140 10/07/2012   K 3.1* 10/07/2012   CL 100 10/07/2012   CO2 24 10/07/2012    RADIOGRAPHIC STUDIES: I have personally reviewed the radiological reports and films with the radiologist Karin Golden (interventional radiology). There is a well-circumscribed complex solid cystic mass in the left proximal medial thigh, that is displacing the femoral vessels adjacent to the pectineus muscle and the femur. There is no significant surrounding edema.  I also reviewed the films with the patient.  ASSESSMENT AND PLAN:  Soft tissue mass Assessment: Left proximal medial thigh soft tissue mass, complex solid cystic well-circumscribed with area of heterogeneity. Differential diagnosis would include benign cystic lesion, myxoid soft tissue sarcoma or synovial cell  sarcoma.  Plan: 1. Obtain PT INR in preparation for ultrasound-guided Tru-Cut biopsy of left proximal medial thigh soft tissue mass (discussed with Dr. Laurence Ferrari of interventional radiology who will perform the procedure). 2. See the patient back to review pathology. If nondiagnostic or benign then plan to move straight to resection. If malignant staging workup followed by development of treatment plan which may include pre-or postoperative radiation therapy and/or chemotherapy if synovial cell sarcoma.   All questions were answered. The patient knows to call the clinic with any problems, questions or concerns.    Malachi Bonds, MD 4:04 PM

## 2015-06-19 ENCOUNTER — Telehealth: Payer: Self-pay | Admitting: Nurse Practitioner

## 2015-06-19 ENCOUNTER — Telehealth: Payer: Self-pay | Admitting: General Surgery

## 2015-06-19 NOTE — Telephone Encounter (Signed)
Ir bx order/pof  noted and they will call the patient

## 2015-06-19 NOTE — Telephone Encounter (Signed)
Per Joylene John, NP, informed patient that PT/INR result is adequate for biopsy procedure. She thanks for the call and verbalizes understanding. Denies concerns or needs at this time.

## 2015-06-20 ENCOUNTER — Telehealth: Payer: Self-pay | Admitting: Gynecologic Oncology

## 2015-06-20 NOTE — Telephone Encounter (Signed)
Patient called one hour ago asking if she would be cleared to leave town the day after her US guided biopsy.  She is planning on going to [place] then taking a riverboat to [place] x 1 week.  Called IR and was directed to page PA.  Spoke with Ellender Hose., PA who wanted to check with Dr. Laurence Ferrari since he was aware of the case.  Received call back from [facility] stating Dr. Laurence Ferrari did not see a problem with the patient going out of town the next day.  Attempted to call patient with this information and left message.  Advised her to cal for any questions or concerns.

## 2015-06-21 ENCOUNTER — Encounter: Payer: Self-pay | Admitting: General Surgery

## 2015-06-21 NOTE — Progress Notes (Signed)
Contacted pt to introduce myself as Estate manager/land agent. Asked pt if she had any financial questions or concerns. Pt states she does not. Advised pt she can contact me if she has any questions or concerns if a treatment is established.

## 2015-07-03 ENCOUNTER — Other Ambulatory Visit: Payer: Self-pay | Admitting: Radiology

## 2015-07-04 ENCOUNTER — Ambulatory Visit (HOSPITAL_COMMUNITY)
Admission: RE | Admit: 2015-07-04 | Discharge: 2015-07-04 | Disposition: A | Discharge status: Home / Self Care | Payer: PPO | Source: Ambulatory Visit | Attending: General Surgery | Admitting: General Surgery

## 2015-07-04 ENCOUNTER — Encounter (HOSPITAL_COMMUNITY): Payer: Self-pay

## 2015-07-04 DIAGNOSIS — M799 Soft tissue disorder, unspecified: Secondary | ICD-10-CM | POA: Insufficient documentation

## 2015-07-04 DIAGNOSIS — M7989 Other specified soft tissue disorders: Secondary | ICD-10-CM

## 2015-07-04 DIAGNOSIS — C801 Malignant (primary) neoplasm, unspecified: Secondary | ICD-10-CM

## 2015-07-04 HISTORY — DX: Malignant (primary) neoplasm, unspecified: C80.1

## 2015-07-04 LAB — CBC
HEMATOCRIT: 44.6 % (ref 36.0–46.0)
Hemoglobin: 15.1 g/dL — ABNORMAL HIGH (ref 12.0–15.0)
MCH: 31.6 pg (ref 26.0–34.0)
MCHC: 33.9 g/dL (ref 30.0–36.0)
MCV: 93.3 fL (ref 78.0–100.0)
Platelets: 245 10*3/uL (ref 150–400)
RBC: 4.78 MIL/uL (ref 3.87–5.11)
RDW: 13 % (ref 11.5–15.5)
WBC: 6.9 10*3/uL (ref 4.0–10.5)

## 2015-07-04 LAB — PROTIME-INR
INR: 0.96 (ref 0.00–1.49)
Prothrombin Time: 13 s (ref 11.6–15.2)

## 2015-07-04 LAB — APTT: aPTT: 25 s (ref 24–37)

## 2015-07-04 MED ORDER — FENTANYL CITRATE (PF) 100 MCG/2ML IJ SOLN
INTRAMUSCULAR | Status: AC
  Filled 2015-07-04: qty 4

## 2015-07-04 MED ORDER — FENTANYL CITRATE (PF) 100 MCG/2ML IJ SOLN
INTRAMUSCULAR | Status: AC | PRN
  Administered 2015-07-04: 25 ug via INTRAVENOUS
  Administered 2015-07-04: 50 ug via INTRAVENOUS

## 2015-07-04 MED ORDER — MIDAZOLAM HCL 2 MG/2ML IJ SOLN
INTRAMUSCULAR | Status: AC | PRN
  Administered 2015-07-04: 1 mg via INTRAVENOUS
  Administered 2015-07-04: 0.5 mg via INTRAVENOUS

## 2015-07-04 MED ORDER — SODIUM CHLORIDE 0.9 % IV SOLN
Freq: Once | INTRAVENOUS | Status: AC
  Administered 2015-07-04: 12:00:00 via INTRAVENOUS

## 2015-07-04 MED ORDER — MIDAZOLAM HCL 2 MG/2ML IJ SOLN
INTRAMUSCULAR | Status: DC
Start: 2015-07-04 — End: 2015-07-05
  Filled 2015-07-04: qty 4

## 2015-07-04 NOTE — Discharge Instructions (Signed)
Call [facility] Radiology [phone]  for any problems or questions,gross bleeding or pain not controlled by Tylenol. No driving today. No change in medications or diet.    Conscious Sedation, Adult, Care After Refer to this sheet in the next few weeks. These instructions provide you with information on caring for yourself after your procedure. Your health care provider may also give you more specific instructions. Your treatment has been planned according to current medical practices, but problems sometimes occur. Call your health care provider if you have any problems or questions after your procedure. WHAT TO EXPECT AFTER THE PROCEDURE  After your procedure:  You may feel sleepy, clumsy, and have poor balance for several hours.  Vomiting may occur if you eat too soon after the procedure. HOME CARE INSTRUCTIONS  Do not participate in any activities where you could become injured for at least 24 hours. Do not:  Drive.  Swim.  Ride a bicycle.  Operate heavy machinery.  Cook.  Use power tools.  Climb ladders.  Work from a high place.  Do not make important decisions or sign legal documents until you are improved.  If you vomit, drink water, juice, or soup when you can drink without vomiting. Make sure you have little or no nausea before eating solid foods.  Only take over-the-counter or prescription medicines for pain, discomfort, or fever as directed by your health care provider.  Make sure you and your family fully understand everything about the medicines given to you, including what side effects may occur.  You should not drink alcohol, take sleeping pills, or take medicines that cause drowsiness for at least 24 hours.  If you smoke, do not smoke without supervision.  If you are feeling better, you may resume normal activities 24 hours after you were sedated.  Keep all appointments with your health care provider. SEEK MEDICAL CARE IF:  Your skin is pale or bluish  in color.  You continue to feel nauseous or vomit.  Your pain is getting worse and is not helped by medicine.  You have bleeding or swelling.  You are still sleepy or feeling clumsy after 24 hours. SEEK IMMEDIATE MEDICAL CARE IF:  You develop a rash.  You have difficulty breathing.  You develop any type of allergic problem.  You have a fever. MAKE SURE YOU:  Understand these instructions.  Will watch your condition.  Will get help right away if you are not doing well or get worse. Document Released: 07/12/2013 Document Reviewed: 07/12/2013 [facility] Patient Information 2015 [place], [place]. This information is not intended to replace advice given to you by your health care provider. Make sure you discuss any questions you have with your health care provider. Fine Needle Aspiration Fine needle aspiration is a procedure used to remove a piece of tissue. The tissue may be removed from a swelling or abnormal growth (tumor). It is also used to confirm a cyst. A cyst is a fluid filled sac. The procedure may be done by your caregiver or a specialist. LET YOUR CAREGIVER KNOW ABOUT:   Any medications you take, especially blood thinners like aspirin.  Any problems you may have had with similar procedures in the past. RISKS AND COMPLICATIONS This is a safe procedure. There is a very small risk of infection and or bleeding. Other complications can occur if the aspiration site is deep in the body. Your caregiver or specialist will explain this to you.  BEFORE THE PROCEDURE   No special preparation is needed  in most cases. Your caregiver will let you know if there are special requirements, such as an empty stomach.  Be sure to ask your caregiver any questions before the procedure starts. PROCEDURE   This procedure is done under local or no anesthetic.  The skin is cleaned carefully.  A thin needle is directed into a lump. The needle is directed in several different directions into the  lump while suction is applied to the needle. When samples are removed, the needle is withdrawn.  The contents obtained are placed on a slide. They are then fixed, stained and examined under the microscope. The slide is examined by a specialist in the examination of tissue (pathologist).  A diagnosis can then be made. The pathologist will decide if the specimen is cancerous (malignant) or not cancerous (benign). If fluid is taken from a cyst, cells from the fluid can be examined. If no material is obtained from a fine needle aspiration, the sample may not rule out a problem. Sometimes the procedure is done again. The pathologist may need several days before a result is available. AFTER THE PROCEDURE   There are usually no limits on diet or activity after a fine needle aspiration.  Your caregiver may give you instructions regarding the aspiration site. This will include information about keeping the site clean and dry. Follow these instructions carefully.  Call for your test results as instructed by your caregiver. Remember, it is your responsibility to get the results of your testing. Do not think everything is fine if you have not heard from your caregiver.  Keep a close watch on the aspiration site. Report any redness, swelling or drainage.  You should not have much pain at the aspiration site.  Take any medications as told by your caregiver. SEEK MEDICAL CARE IF:   You have pain or drainage at the aspiration site that does not go away.  Swelling at the aspiration site does not gradually go away. SEEK IMMEDIATE MEDICAL CARE IF:   You develop a fever a day or two after the aspiration.  You develop severe pain at the aspiration site.  You develop a warm, tender swelling at the aspiration site. Document Released: 09/18/2000 Document Revised: 12/14/2011 Document Reviewed: 06/01/2008 [facility] Patient Information 2015 [place], [place]. This information is not intended to replace advice given  to you by your health care provider. Make sure you discuss any questions you have with your health care provider.

## 2015-07-04 NOTE — Procedures (Signed)
Interventional Radiology Procedure Note  Procedure: US guided bx left thigh mass  Complications: None  Estimated Blood Loss: 0  Recommendations:  - Bedrest x 1 hr  Signed,  Criselda Peaches, MD

## 2015-07-04 NOTE — H&P (Signed)
HPI: Patient with left thigh soft tissue mass that is slowly enlarging in size. She has had a MRI on 06/03/15 and has been seen by Dr. Rolanda Jay and scheduled today for image guided left thigh soft tissue mass biopsy.  The patient has had a H&P performed within the last 30 days, all history, medications, and exam have been reviewed. The patient denies any interval changes since the H&P.  Medications: Prior to Admission medications   Medication Sig Start Date End Date Taking? Authorizing Kellene Mccleary  ALPRAZolam Duanne Moron) 0.5 MG tablet Take 0.5 mg by mouth at bedtime as needed for anxiety or sleep.    Yes Historical Tedford Berg, MD  Ascorbic Acid (VITAMIN C) 100 MG tablet Take 100 mg by mouth 3 (three) times a week.    Yes Historical Bronislaw Switzer, MD  B Complex-C (SUPER B COMPLEX PO) Take 1 tablet by mouth 3 (three) times a week.   Yes Historical Burt Piatek, MD  MAGNESIUM PO Take 1 tablet by mouth 3 (three) times a week.   Yes Historical Makenly Larabee, MD  Polyethyl Glycol-Propyl Glycol (SYSTANE OP) Apply 1-2 drops to eye daily as needed (dry eyes).   Yes Historical Fendi Meinhardt, MD  zolpidem (AMBIEN) 5 MG tablet Take 2.5 mg by mouth at bedtime as needed for sleep.  04/15/15  Yes Historical Ajay Strubel, MD  acetaminophen (TYLENOL) 325 MG tablet Take 2 tablets (650 mg total) by mouth every 6 (six) hours as needed (or Fever >/= 101). Patient not taking: Reported on 06/18/2015 10/07/12   Shon Baton, MD  cetirizine (ZYRTEC) 10 MG tablet Take 10 mg by mouth daily as needed for allergies.    Historical Harsh Trulock, MD  fluorouracil (EFUDEX) 5 % cream APPLY TWICE DAILY TO AFFECTED SUN DAMAGED AREAS UP TO 3 WEEKS AS TOLERATED 05/13/15   Historical Marge Vandermeulen, MD  fluticasone (FLONASE) 50 MCG/ACT nasal spray Place 1 spray into both nostrils daily as needed for allergies or rhinitis.    Historical Ezrah Dembeck, MD  Fluticasone-Salmeterol (ADVAIR DISKUS) 250-50 MCG/DOSE AEPB Inhale 1 puff into the lungs 2 (two) times daily. Patient not taking:  Reported on 07/01/2015 10/07/12   Shon Baton, MD  naproxen sodium (ANAPROX) 220 MG tablet Take 220 mg by mouth daily as needed (pain.).    Historical Darci Lykins, MD  Probiotic Product (ALIGN) 4 MG CAPS Take 4 mg by mouth daily. Patient not taking: Reported on 06/18/2015 10/07/12   Shon Baton, MD    Vital Signs: BP 142/75 mmHg  Pulse 82  Temp(Src) 97.8 F (36.6 C) (Oral)  Resp 18  SpO2 100%  Physical Exam  Constitutional: She is oriented to person, place, and time. No distress.  HENT:  Head: Normocephalic and atraumatic.  Neck: No tracheal deviation present.  Cardiovascular: Normal rate and regular rhythm.  Exam reveals no gallop and no friction rub.   No murmur heard. Pulmonary/Chest: Effort normal and breath sounds normal. No respiratory distress. She has no wheezes. She has no rales.  Abdominal: Soft. Bowel sounds are normal. She exhibits no distension. There is no tenderness.  Musculoskeletal:  Left thigh mass  Neurological: She is alert and oriented to person, place, and time.  Skin: She is not diaphoretic.    Mallampati Score:  MD Evaluation Airway: WNL Heart: WNL Abdomen: WNL Chest/ Lungs: WNL ASA  Classification: 2 Mallampati/Airway Score: One  Labs:  CBC:  Recent Labs  07/04/15 1130  WBC 6.9  HGB 15.1*  HCT 44.6  PLT 245    COAGS:  Recent Labs  06/18/15 1559  07/04/15 1130  INR 0.90* 0.96  APTT  --  25    Assessment/Plan:  Left thigh soft tissue mass, slowly enlarging  Seen by Dr. Rolanda Jay 06/18/15 Scheduled today for image guided left thigh soft tissue mass biopsy with sedation The patient has been NPO, no blood thinners taken, labs and vitals have been reviewed. Risks and Benefits discussed with the patient including, but not limited to bleeding, infection, damage to adjacent structures or low yield requiring additional tests. All of the patient's questions were answered, patient is agreeable to proceed. Consent signed and in  chart.    SignedHedy Jacob 07/04/2015, 12:13 PM

## 2015-07-09 ENCOUNTER — Telehealth: Payer: Self-pay | Admitting: Gynecologic Oncology

## 2015-07-09 NOTE — Telephone Encounter (Signed)
Spoke with patient about current status post biopsy of left thigh mass.  She states she is doing well.  Currently still on her trip.  Informed of the pathology results from the biopsy.  Advised that the biopsy was highly suspicious for malignancy but pathology cannot confirm without a larger sample.  Patient verbalizing understanding and states she will call the office when she gets back into town.  Advised to call for any questions or concerns.

## 2015-07-15 ENCOUNTER — Telehealth: Payer: Self-pay | Admitting: Gynecologic Oncology

## 2015-07-15 NOTE — Telephone Encounter (Signed)
When I spoke with patient last week, she stated she would call the office once she gets back into town.  Left message for the patient today about arranging an appt to meet with Dr. Rolanda Jay tomorrow in the office since Tues is his only clinic day this week.  Surgery has been scheduled for Oct 19.  Advised to call the office.

## 2015-07-15 NOTE — Telephone Encounter (Signed)
Attempted to call patient but unavailable.

## 2015-07-16 ENCOUNTER — Ambulatory Visit (HOSPITAL_BASED_OUTPATIENT_CLINIC_OR_DEPARTMENT_OTHER): Payer: PPO | Admitting: General Surgery

## 2015-07-16 ENCOUNTER — Ambulatory Visit: Payer: PPO | Admitting: General Surgery

## 2015-07-16 VITALS — BP 149/83 | HR 80 | Temp 98.2°F | Wt 134.1 lb

## 2015-07-16 DIAGNOSIS — M799 Soft tissue disorder, unspecified: Secondary | ICD-10-CM | POA: Diagnosis not present

## 2015-07-16 DIAGNOSIS — R2242 Localized swelling, mass and lump, left lower limb: Secondary | ICD-10-CM

## 2015-07-16 DIAGNOSIS — M7989 Other specified soft tissue disorders: Secondary | ICD-10-CM

## 2015-07-16 NOTE — Patient Instructions (Signed)
Preparing for your Surgery  Plan for surgery on October 19 with Dr. Rolanda Jay.  You will be scheduled for a left proximal medial thigh excision of a soft tissue mass.  Pre-operative Testing -You will receive a phone call from presurgical testing at [facility] to arrange for a pre-operative testing appointment before your surgery.  This appointment normally occurs one to two weeks before your scheduled surgery.   -Bring your insurance card, copy of an advanced directive if applicable, medication list  -At that visit, you will be asked to sign a consent for a possible blood transfusion in case a transfusion becomes necessary during surgery.  The need for a blood transfusion is rare but having consent is a necessary part of your care.     -You should not be taking blood thinners or aspirin at least ten days prior to surgery unless instructed by your surgeon.  Day Before Surgery at [facility] will be advised to have nothing to eat or drink after midnight the evening before.    Your role in recovery - Cough and breathe deeply. This helps toclear and expand your lungs and can prevent pneumonia. You may be given a spirometer to practice deep breathing. A staff member will show you how to use the spirometer. - Actively manage your pain. Managing your pain lets you move in comfort. We will ask you to rate your pain on a scale of zero to 10. It is your responsibility to tell your doctor or nurse where and how much you hurt so your pain can be treated.  Special Considerations -If you are diabetic, you may be placed on insulin after surgery to have closer control over your blood sugars to promote healing and recovery.  This does not mean that you will be discharged on insulin.  If applicable, your oral antidiabetics will be resumed when you are tolerating a solid diet.  -Your final pathology results from surgery should be available by the Friday after surgery and the results will be  relayed to you when available.  Blood Transfusion Information WHAT IS A BLOOD TRANSFUSION? A transfusion is the replacement of blood or some of its parts. Blood is made up of multiple cells which provide different functions.  Red blood cells carry oxygen and are used for blood loss replacement.  White blood cells fight against infection.  Platelets control bleeding.  Plasma helps clot blood.  Other blood products are available for specialized needs, such as hemophilia or other clotting disorders. BEFORE THE TRANSFUSION  Who gives blood for transfusions?   You may be able to donate blood to be used at a later date on yourself (autologous donation).  Relatives can be asked to donate blood. This is generally not any safer than if you have received blood from a stranger. The same precautions are taken to ensure safety when a relative's blood is donated.  Healthy volunteers who are fully evaluated to make sure their blood is safe. This is blood bank blood. Transfusion therapy is the safest it has ever been in the practice of medicine. Before blood is taken from a donor, a complete history is taken to make sure that person has no history of diseases nor engages in risky social behavior (examples are intravenous drug use or sexual activity with multiple partners). The donor's travel history is screened to minimize risk of transmitting infections, such as malaria. The donated blood is tested for signs of infectious diseases, such as HIV and hepatitis. The blood is  then tested to be sure it is compatible with you in order to minimize the chance of a transfusion reaction. If you or a relative donates blood, this is often done in anticipation of surgery and is not appropriate for emergency situations. It takes many days to process the donated blood. RISKS AND COMPLICATIONS Although transfusion therapy is very safe and saves many lives, the main dangers of transfusion include:   Getting an infectious  disease.  Developing a transfusion reaction. This is an allergic reaction to something in the blood you were given. Every precaution is taken to prevent this. The decision to have a blood transfusion has been considered carefully by your caregiver before blood is given. Blood is not given unless the benefits outweigh the risks.

## 2015-07-17 NOTE — Assessment & Plan Note (Signed)
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  I personally reviewed this with the pathologist and discussed the case with Dr. Lisbeth Renshaw of Radiation Oncology.    Plan:  Extended discussion of pathology and implications (over 30 min in length, all face to face).  Decision with patient is to move forward with radical resection of the Left Proximal medical soft tissue mass and further treatments to be based on final pathology.    The planned procedure was reviewed at length including discussion around the location being near the neurovascular structures and attendant risk of bleeding, permanent nerve injury with associated numbness, pain or weakness of the leg, as well as blood clot.  She understands that she will likely have a drain in place and the risk of infection, fluid collection and need for further surgery are present.  She also understands that we will have her on anti-coagulation in the post-operative setting for a few weeks and that this will require injections. All questions were answered and we have her tentatively scheduled for [facility] 07/24/15.  Pre-operative anesthesia appointments will be set up and we will obtain a PA/Lat of Chest.

## 2015-07-17 NOTE — Progress Notes (Signed)
[facility] NOTE  Patient Care Team: Shon Baton, MD as PCP - General (Internal Medicine)  CHIEF COMPLAINTS/PURPOSE OF CONSULTATION:    HISTORY OF PRESENTING ILLNESS:  Tiffany Franklin 69 y.o.lady with a proximal medial soft tissue mass who returns today for review of her pathology following percutaneous biopsy targeting the solid component of the lesion.  I personally reviewed the case with the pathologist and Dr. Lisbeth Renshaw of Radiation oncology.   She has no new complaints and reports no complications of the biopsy.   I reviewed her records extensively and collaborated the history with the patient.  SUMMARY OF ONCOLOGIC HISTORY:   Breast cancer, left breast ([place]) (Resolved)   06/18/2015 Initial Diagnosis Breast cancer, left breast    MEDICAL HISTORY:  Past Medical History  Diagnosis Date  . Pneumonia     SURGICAL HISTORY: Past Surgical History  Procedure Laterality Date  . Abdominal hysterectomy      SOCIAL HISTORY: Social History   Social History  . Marital Status: Widowed    Spouse Name: N/A  . Number of Children: N/A  . Years of Education: N/A   Occupational History  . Not on file.   Social History Main Topics  . Smoking status: Never Smoker   . Smokeless tobacco: Not on file  . Alcohol Use: Yes     Comment: occ  . Drug Use: No  . Sexual Activity: Not on file   Other Topics Concern  . Not on file   Social History Narrative    FAMILY HISTORY: No family history on file.  ALLERGIES:  is allergic to demerol.  MEDICATIONS:  Current Outpatient Prescriptions  Medication Sig Dispense Refill  . acetaminophen (TYLENOL) 325 MG tablet Take 2 tablets (650 mg total) by mouth every 6 (six) hours as needed (or Fever >/= 101).    Marland Kitchen ALPRAZolam (XANAX) 0.25 MG tablet Take 0.25 mg by mouth at bedtime as needed.  3  . Ascorbic Acid (VITAMIN C) 100 MG tablet Take 100 mg by mouth every Monday, Wednesday, and Friday.     . B Complex-C (SUPER B  COMPLEX PO) Take 1 tablet by mouth every Monday, Wednesday, and Friday.     . cetirizine (ZYRTEC) 10 MG tablet Take 10 mg by mouth daily as needed for allergies.    . fluticasone (FLONASE) 50 MCG/ACT nasal spray Place 1 spray into both nostrils daily as needed for allergies or rhinitis.    . Fluticasone-Salmeterol (ADVAIR DISKUS) 250-50 MCG/DOSE AEPB Inhale 1 puff into the lungs 2 (two) times daily. (Patient taking differently: Inhale 1 puff into the lungs 2 (two) times daily as needed (when sick). ) 60 each 0  . MAGNESIUM PO Take 1 tablet by mouth every Monday, Wednesday, and Friday.     . naproxen sodium (ANAPROX) 220 MG tablet Take 220 mg by mouth daily as needed (pain.).    Marland Kitchen Polyethyl Glycol-Propyl Glycol (SYSTANE OP) Apply 1-2 drops to eye daily as needed (dry eyes).    . Probiotic Product (ALIGN) 4 MG CAPS Take 4 mg by mouth daily. (Patient taking differently: Take 4 mg by mouth daily as needed (when taking antibiotics). ) 14 capsule 0  . zolpidem (AMBIEN) 5 MG tablet Take 2.5 mg by mouth at bedtime as needed for sleep.   3   No current facility-administered medications for this visit.     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  I personally reviewed this with the pathologist and discussed the case with Dr. Lisbeth Renshaw of Radiation Oncology.    Plan:  Extended discussion of pathology and implications (over 30 min in length, all face to face).  Decision with patient is to move forward with radical resection of the Left Proximal medical soft tissue mass and further treatments to be based on final pathology.    The planned procedure was reviewed at length including discussion around the location being near the neurovascular structures and attendant risk of bleeding, permanent nerve injury with associated numbness, pain or weakness of the leg, as well as blood clot.  She understands that  she will likely have a drain in place and the risk of infection, fluid collection and need for further surgery are present.  She also understands that we will have her on anti-coagulation in the post-operative setting for a few weeks and that this will require injections. All questions were answered and we have her tentatively scheduled for [facility] 07/24/15.  Pre-operative anesthesia appointments will be set up and we will obtain a PA/Lat of Chest.       Frederich Cha, MD 3:30 PM

## 2015-07-19 ENCOUNTER — Ambulatory Visit (HOSPITAL_COMMUNITY)
Admission: RE | Admit: 2015-07-19 | Discharge: 2015-07-19 | Disposition: A | Discharge status: Home / Self Care | Payer: PPO | Source: Ambulatory Visit | Attending: Gynecologic Oncology | Admitting: Gynecologic Oncology

## 2015-07-19 ENCOUNTER — Encounter (HOSPITAL_COMMUNITY): Payer: Self-pay

## 2015-07-19 ENCOUNTER — Encounter (HOSPITAL_COMMUNITY)
Admission: RE | Admit: 2015-07-19 | Discharge: 2015-07-19 | Disposition: A | Discharge status: Home / Self Care | Payer: PPO | Source: Ambulatory Visit | Attending: General Surgery | Admitting: General Surgery

## 2015-07-19 DIAGNOSIS — R221 Localized swelling, mass and lump, neck: Secondary | ICD-10-CM | POA: Diagnosis not present

## 2015-07-19 DIAGNOSIS — Z01812 Encounter for preprocedural laboratory examination: Secondary | ICD-10-CM | POA: Diagnosis not present

## 2015-07-19 DIAGNOSIS — Z8701 Personal history of pneumonia (recurrent): Secondary | ICD-10-CM | POA: Diagnosis not present

## 2015-07-19 DIAGNOSIS — M7989 Other specified soft tissue disorders: Secondary | ICD-10-CM

## 2015-07-19 DIAGNOSIS — Z0183 Encounter for blood typing: Secondary | ICD-10-CM | POA: Insufficient documentation

## 2015-07-19 DIAGNOSIS — Z01818 Encounter for other preprocedural examination: Secondary | ICD-10-CM | POA: Insufficient documentation

## 2015-07-19 HISTORY — DX: Unspecified cataract: H26.9

## 2015-07-19 HISTORY — DX: Unspecified osteoarthritis, unspecified site: M19.90

## 2015-07-19 HISTORY — DX: Anxiety disorder, unspecified: F41.9

## 2015-07-19 HISTORY — DX: Tremor, unspecified: R25.1

## 2015-07-19 LAB — BASIC METABOLIC PANEL
ANION GAP: 7 (ref 5–15)
BUN: 16 mg/dL (ref 6–20)
CALCIUM: 9.9 mg/dL (ref 8.9–10.3)
CO2: 27 mmol/L (ref 22–32)
CREATININE: 0.51 mg/dL (ref 0.44–1.00)
Chloride: 108 mmol/L (ref 101–111)
GLUCOSE: 92 mg/dL (ref 65–99)
Potassium: 5.1 mmol/L (ref 3.5–5.1)
Sodium: 142 mmol/L (ref 135–145)

## 2015-07-19 LAB — ABO/RH: ABO/RH(D): O NEG

## 2015-07-19 NOTE — Patient Instructions (Signed)
Tiffany Franklin  07/19/2015   Your procedure is scheduled on: Wednesday Octobeer 19, 2016   Report to [facility]  Entrance take [facility]  elevators to 3rd floor to  [place] at 6:30 AM.  Call this number if you have problems the morning of surgery [phone]   Remember: ONLY 1 PERSON MAY GO WITH YOU TO SHORT STAY TO GET  READY MORNING OF [place].  Do not eat food or drink liquids :After Midnight.     Take these medicines the morning of surgery with A SIP OF WATER: Cetirizine (Zyrtec) if needed; Flonase if needed (bring with you day of surgery); Advair if needed (bring with you day of surgery); Systane eye drops if needed (bring with you day of surgery)                               You may not have any metal on your body including hair pins and              piercings  Do not wear jewelry, make-up, lotions, powders or perfumes, deodorant             Do not wear nail polish.  Do not shave  48 hours prior to surgery.               Do not bring valuables to the hospital. [place].  Contacts, dentures or bridgework may not be worn into surgery.  Leave suitcase in the car. After surgery it may be brought to your room.                Please read over the following fact sheets you were given:INCENTIVE SPIROMETER; BLOOD TRANSFUSION INFORMATION SHEET _____________________________________________________________________             [facility] - Preparing for Surgery Before surgery, you can play an important role.  Because skin is not sterile, your skin needs to be as free of germs as possible.  You can reduce the number of germs on your skin by washing with CHG (chlorahexidine gluconate) soap before surgery.  CHG is an antiseptic cleaner which kills germs and bonds with the skin to continue killing germs even after washing. Please DO NOT use if you have an allergy to CHG or antibacterial soaps.  If  your skin becomes reddened/irritated stop using the CHG and inform your nurse when you arrive at Short Stay. Do not shave (including legs and underarms) for at least 48 hours prior to the first CHG shower.  You may shave your face/neck. Please follow these instructions carefully:  1.  Shower with CHG Soap the night before surgery and the  morning of Surgery.  2.  If you choose to wash your hair, wash your hair first as usual with your  normal  shampoo.  3.  After you shampoo, rinse your hair and body thoroughly to remove the  shampoo.                           4.  Use CHG as you would any other liquid soap.  You can apply chg directly  to the skin and wash  Gently with a scrungie or clean washcloth.  5.  Apply the CHG Soap to your body ONLY FROM THE NECK DOWN.   Do not use on face/ open                           Wound or open sores. Avoid contact with eyes, ears mouth and genitals (private parts).                       Wash face,  Genitals (private parts) with your normal soap.             6.  Wash thoroughly, paying special attention to the area where your surgery  will be performed.  7.  Thoroughly rinse your body with warm water from the neck down.  8.  DO NOT shower/wash with your normal soap after using and rinsing off  the CHG Soap.                9.  Pat yourself dry with a clean towel.            10.  Wear clean pajamas.            11.  Place clean sheets on your bed the night of your first shower and do not  sleep with pets. Day of Surgery : Do not apply any lotions/deodorants the morning of surgery.  Please wear clean clothes to the hospital/surgery center.  FAILURE TO FOLLOW THESE INSTRUCTIONS MAY RESULT IN THE CANCELLATION OF YOUR SURGERY PATIENT SIGNATURE_________________________________  NURSE SIGNATURE__________________________________  ________________________________________________________________________   Adam Phenix  An incentive  spirometer is a tool that can help keep your lungs clear and active. This tool measures how well you are filling your lungs with each breath. Taking long deep breaths may help reverse or decrease the chance of developing breathing (pulmonary) problems (especially infection) following:  A long period of time when you are unable to move or be active. BEFORE THE PROCEDURE   If the spirometer includes an indicator to show your best effort, your nurse or respiratory therapist will set it to a desired goal.  If possible, sit up straight or lean slightly forward. Try not to slouch.  Hold the incentive spirometer in an upright position. INSTRUCTIONS FOR USE   Sit on the edge of your bed if possible, or sit up as far as you can in bed or on a chair.  Hold the incentive spirometer in an upright position.  Breathe out normally.  Place the mouthpiece in your mouth and seal your lips tightly around it.  Breathe in slowly and as deeply as possible, raising the piston or the ball toward the top of the column.  Hold your breath for 3-5 seconds or for as long as possible. Allow the piston or ball to fall to the bottom of the column.  Remove the mouthpiece from your mouth and breathe out normally.  Rest for a few seconds and repeat Steps 1 through 7 at least 10 times every 1-2 hours when you are awake. Take your time and take a few normal breaths between deep breaths.  The spirometer may include an indicator to show your best effort. Use the indicator as a goal to work toward during each repetition.  After each set of 10 deep breaths, practice coughing to be sure your lungs are clear. If you have an incision (the cut made at the time of surgery),  support your incision when coughing by placing a pillow or rolled up towels firmly against it. Once you are able to get out of bed, walk around indoors and cough well. You may stop using the incentive spirometer when instructed by your caregiver.  RISKS AND  COMPLICATIONS  Take your time so you do not get dizzy or light-headed.  If you are in pain, you may need to take or ask for pain medication before doing incentive spirometry. It is harder to take a deep breath if you are having pain. AFTER USE  Rest and breathe slowly and easily.  It can be helpful to keep track of a log of your progress. Your caregiver can provide you with a simple table to help with this. If you are using the spirometer at home, follow these instructions: [place] IF:   You are having difficultly using the spirometer.  You have trouble using the spirometer as often as instructed.  Your pain medication is not giving enough relief while using the spirometer.  You develop fever of 100.5 F (38.1 C) or higher. SEEK IMMEDIATE MEDICAL CARE IF:   You cough up bloody sputum that had not been present before.  You develop fever of 102 F (38.9 C) or greater.  You develop worsening pain at or near the incision site. MAKE SURE YOU:   Understand these instructions.  Will watch your condition.  Will get help right away if you are not doing well or get worse. Document Released: 02/01/2007 Document Revised: 12/14/2011 Document Reviewed: 04/04/2007 Exit[employer] Patient Information 2014 [employer], [place].   ________________________________________________________________________  WHAT IS A BLOOD TRANSFUSION? Blood Transfusion Information  A transfusion is the replacement of blood or some of its parts. Blood is made up of multiple cells which provide different functions.  Red blood cells carry oxygen and are used for blood loss replacement.  White blood cells fight against infection.  Platelets control bleeding.  Plasma helps clot blood.  Other blood products are available for specialized needs, such as hemophilia or other clotting disorders. BEFORE THE TRANSFUSION  Who gives blood for transfusions?   Healthy volunteers who are fully evaluated to make sure  their blood is safe. This is blood bank blood. Transfusion therapy is the safest it has ever been in the practice of medicine. Before blood is taken from a donor, a complete history is taken to make sure that person has no history of diseases nor engages in risky social behavior (examples are intravenous drug use or sexual activity with multiple partners). The donor's travel history is screened to minimize risk of transmitting infections, such as malaria. The donated blood is tested for signs of infectious diseases, such as HIV and hepatitis. The blood is then tested to be sure it is compatible with you in order to minimize the chance of a transfusion reaction. If you or a relative donates blood, this is often done in anticipation of surgery and is not appropriate for emergency situations. It takes many days to process the donated blood. RISKS AND COMPLICATIONS Although transfusion therapy is very safe and saves many lives, the main dangers of transfusion include:   Getting an infectious disease.  Developing a transfusion reaction. This is an allergic reaction to something in the blood you were given. Every precaution is taken to prevent this. The decision to have a blood transfusion has been considered carefully by your caregiver before blood is given. Blood is not given unless the benefits outweigh the risks. AFTER THE TRANSFUSION  Right after receiving a blood transfusion, you will usually feel much better and more energetic. This is especially true if your red blood cells have gotten low (anemic). The transfusion raises the level of the red blood cells which carry oxygen, and this usually causes an energy increase.  The nurse administering the transfusion will monitor you carefully for complications. HOME CARE INSTRUCTIONS  No special instructions are needed after a transfusion. You may find your energy is better. Speak with your caregiver about any limitations on activity for underlying diseases  you may have. SEEK MEDICAL CARE IF:   Your condition is not improving after your transfusion.  You develop redness or irritation at the intravenous (IV) site. SEEK IMMEDIATE MEDICAL CARE IF:  Any of the following symptoms occur over the next 12 hours:  Shaking chills.  You have a temperature by mouth above 102 F (38.9 C), not controlled by medicine.  Chest, back, or muscle pain.  People around you feel you are not acting correctly or are confused.  Shortness of breath or difficulty breathing.  Dizziness and fainting.  You get a rash or develop hives.  You have a decrease in urine output.  Your urine turns a dark color or changes to pink, red, or brown. Any of the following symptoms occur over the next 10 days:  You have a temperature by mouth above 102 F (38.9 C), not controlled by medicine.  Shortness of breath.  Weakness after normal activity.  The white part of the eye turns yellow (jaundice).  You have a decrease in the amount of urine or are urinating less often.  Your urine turns a dark color or changes to pink, red, or brown. Document Released: 09/18/2000 Document Revised: 12/14/2011 Document Reviewed: 05/07/2008 [facility] Patient Information 2014 [place], [place].  _______________________________________________________________________

## 2015-07-19 NOTE — Progress Notes (Signed)
CBC,PT and PTT results/epic 07/04/2015

## 2015-07-22 ENCOUNTER — Telehealth: Payer: Self-pay | Admitting: Gynecologic Oncology

## 2015-07-22 NOTE — Telephone Encounter (Signed)
Patient informed of pre-op chest xray and Bmet results.  No concerns voiced.  Advised to call for any needs or concerns.

## 2015-07-24 ENCOUNTER — Encounter (HOSPITAL_COMMUNITY): Payer: Self-pay | Admitting: *Deleted

## 2015-07-24 ENCOUNTER — Ambulatory Visit (HOSPITAL_COMMUNITY): Payer: PPO | Admitting: Anesthesiology

## 2015-07-24 ENCOUNTER — Observation Stay (HOSPITAL_COMMUNITY)
Admission: RE | Admit: 2015-07-24 | Discharge: 2015-07-25 | Disposition: A | Discharge status: Home / Self Care | Payer: PPO | Source: Ambulatory Visit | Attending: General Surgery | Admitting: General Surgery

## 2015-07-24 ENCOUNTER — Encounter (HOSPITAL_COMMUNITY)
Admission: RE | Disposition: A | Discharge status: Home / Self Care | Payer: Self-pay | Source: Ambulatory Visit | Attending: General Surgery

## 2015-07-24 DIAGNOSIS — F419 Anxiety disorder, unspecified: Secondary | ICD-10-CM | POA: Insufficient documentation

## 2015-07-24 DIAGNOSIS — M7989 Other specified soft tissue disorders: Secondary | ICD-10-CM | POA: Diagnosis present

## 2015-07-24 DIAGNOSIS — D2122 Benign neoplasm of connective and other soft tissue of left lower limb, including hip: Secondary | ICD-10-CM | POA: Diagnosis not present

## 2015-07-24 DIAGNOSIS — Z885 Allergy status to narcotic agent status: Secondary | ICD-10-CM | POA: Insufficient documentation

## 2015-07-24 DIAGNOSIS — M1990 Unspecified osteoarthritis, unspecified site: Secondary | ICD-10-CM | POA: Diagnosis not present

## 2015-07-24 LAB — CBC
HEMATOCRIT: 40.2 % (ref 36.0–46.0)
Hemoglobin: 13.7 g/dL (ref 12.0–15.0)
MCH: 32.1 pg (ref 26.0–34.0)
MCHC: 34.1 g/dL (ref 30.0–36.0)
MCV: 94.1 fL (ref 78.0–100.0)
PLATELETS: 218 10*3/uL (ref 150–400)
RBC: 4.27 MIL/uL (ref 3.87–5.11)
RDW: 13.2 % (ref 11.5–15.5)
WBC: 8.7 10*3/uL (ref 4.0–10.5)

## 2015-07-24 LAB — TYPE AND SCREEN
ABO/RH(D): O NEG
ANTIBODY SCREEN: NEGATIVE

## 2015-07-24 LAB — CREATININE, SERUM: Creatinine, Ser: 0.55 mg/dL (ref 0.44–1.00)

## 2015-07-24 SURGERY — EXCISION MASS LOWER EXTREMITIES
Anesthesia: General | Site: Thigh | Laterality: Left

## 2015-07-24 MED ORDER — FLUTICASONE PROPIONATE 50 MCG/ACT NA SUSP: 1.0000 | Freq: Every day | NASAL | Status: DC | PRN

## 2015-07-24 MED ORDER — LIDOCAINE HCL (CARDIAC) 20 MG/ML IV SOLN
INTRAVENOUS | Status: DC | PRN
  Administered 2015-07-24: 100 mg via INTRAVENOUS

## 2015-07-24 MED ORDER — DEXAMETHASONE SODIUM PHOSPHATE 10 MG/ML IJ SOLN
INTRAMUSCULAR | Status: DC | PRN
  Administered 2015-07-24: 10 mg via INTRAVENOUS

## 2015-07-24 MED ORDER — FENTANYL CITRATE (PF) 100 MCG/2ML IJ SOLN: 25.0000 ug | INTRAMUSCULAR | Status: DC | PRN

## 2015-07-24 MED ORDER — CEFAZOLIN SODIUM-DEXTROSE 2-3 GM-% IV SOLR
2.0000 g | INTRAVENOUS | Status: AC
Start: 2015-07-24 — End: 2015-07-24
  Administered 2015-07-24: 2 g via INTRAVENOUS

## 2015-07-24 MED ORDER — SODIUM CHLORIDE 0.9 % IJ SOLN
INTRAMUSCULAR | Status: AC
  Filled 2015-07-24: qty 10

## 2015-07-24 MED ORDER — BUPIVACAINE-EPINEPHRINE (PF) 0.25% -1:200000 IJ SOLN
INTRAMUSCULAR | Status: DC | PRN
  Administered 2015-07-24: 20 mL via PERINEURAL

## 2015-07-24 MED ORDER — BUPIVACAINE-EPINEPHRINE (PF) 0.25% -1:200000 IJ SOLN
INTRAMUSCULAR | Status: AC
  Filled 2015-07-24: qty 30

## 2015-07-24 MED ORDER — NEOSTIGMINE METHYLSULFATE 10 MG/10ML IV SOLN
INTRAVENOUS | Status: DC | PRN
  Administered 2015-07-24: 2 mg via INTRAVENOUS

## 2015-07-24 MED ORDER — ONDANSETRON HCL 4 MG/2ML IJ SOLN: 4.0000 mg | Freq: Three times a day (TID) | INTRAMUSCULAR | Status: DC | PRN

## 2015-07-24 MED ORDER — ZOLPIDEM TARTRATE 5 MG PO TABS
2.5000 mg | ORAL_TABLET | Freq: Every evening | ORAL | Status: DC | PRN
  Administered 2015-07-24: 2.5 mg via ORAL
  Filled 2015-07-24: qty 1

## 2015-07-24 MED ORDER — PROMETHAZINE HCL 25 MG/ML IJ SOLN: 6.2500 mg | INTRAMUSCULAR | Status: DC | PRN

## 2015-07-24 MED ORDER — CEFAZOLIN SODIUM-DEXTROSE 2-3 GM-% IV SOLR
INTRAVENOUS | Status: AC
  Filled 2015-07-24: qty 50

## 2015-07-24 MED ORDER — FENTANYL CITRATE (PF) 250 MCG/5ML IJ SOLN
INTRAMUSCULAR | Status: AC
  Filled 2015-07-24: qty 25

## 2015-07-24 MED ORDER — OXYCODONE-ACETAMINOPHEN 5-325 MG PO TABS
1.0000 | ORAL_TABLET | ORAL | Status: DC | PRN
  Administered 2015-07-25: 2 via ORAL
  Filled 2015-07-24: qty 2

## 2015-07-24 MED ORDER — PHENYLEPHRINE HCL 10 MG/ML IJ SOLN
INTRAMUSCULAR | Status: DC | PRN
  Administered 2015-07-24 (×3): 40 ug via INTRAVENOUS

## 2015-07-24 MED ORDER — ROCURONIUM BROMIDE 100 MG/10ML IV SOLN
INTRAVENOUS | Status: DC | PRN
  Administered 2015-07-24: 50 mg via INTRAVENOUS

## 2015-07-24 MED ORDER — LACTATED RINGERS IV SOLN
INTRAVENOUS | Status: DC | PRN
  Administered 2015-07-24 (×2): via INTRAVENOUS

## 2015-07-24 MED ORDER — FENTANYL CITRATE (PF) 100 MCG/2ML IJ SOLN
INTRAMUSCULAR | Status: DC | PRN
  Administered 2015-07-24: 50 ug via INTRAVENOUS
  Administered 2015-07-24: 100 ug via INTRAVENOUS
  Administered 2015-07-24 (×2): 25 ug via INTRAVENOUS
  Administered 2015-07-24: 50 ug via INTRAVENOUS

## 2015-07-24 MED ORDER — HYDROMORPHONE HCL 2 MG/ML IJ SOLN
INTRAMUSCULAR | Status: AC
  Filled 2015-07-24: qty 1

## 2015-07-24 MED ORDER — LIDOCAINE HCL (CARDIAC) 20 MG/ML IV SOLN
INTRAVENOUS | Status: AC
  Filled 2015-07-24: qty 5

## 2015-07-24 MED ORDER — LORATADINE 10 MG PO TABS
10.0000 mg | ORAL_TABLET | Freq: Every day | ORAL | Status: DC | PRN
  Filled 2015-07-24: qty 1

## 2015-07-24 MED ORDER — ENOXAPARIN (LOVENOX) PATIENT EDUCATION KIT
PACK | Freq: Once | Status: AC
  Administered 2015-07-24: 17:00:00
  Filled 2015-07-24: qty 1

## 2015-07-24 MED ORDER — ENOXAPARIN SODIUM 40 MG/0.4ML ~~LOC~~ SOLN
40.0000 mg | SUBCUTANEOUS | Status: DC
  Administered 2015-07-24: 40 mg via SUBCUTANEOUS
  Filled 2015-07-24 (×2): qty 0.4

## 2015-07-24 MED ORDER — MIDAZOLAM HCL 5 MG/5ML IJ SOLN
INTRAMUSCULAR | Status: DC | PRN
  Administered 2015-07-24 (×2): 1 mg via INTRAVENOUS

## 2015-07-24 MED ORDER — ONDANSETRON HCL 4 MG/2ML IJ SOLN
INTRAMUSCULAR | Status: DC | PRN
  Administered 2015-07-24: 4 mg via INTRAVENOUS

## 2015-07-24 MED ORDER — GLYCOPYRROLATE 0.2 MG/ML IJ SOLN
INTRAMUSCULAR | Status: DC | PRN
  Administered 2015-07-24: 0.3 mg via INTRAVENOUS

## 2015-07-24 MED ORDER — PROPOFOL 10 MG/ML IV BOLUS
INTRAVENOUS | Status: DC | PRN
  Administered 2015-07-24: 150 mg via INTRAVENOUS

## 2015-07-24 MED ORDER — ONDANSETRON HCL 4 MG/2ML IJ SOLN
INTRAMUSCULAR | Status: AC
  Filled 2015-07-24: qty 2

## 2015-07-24 MED ORDER — ENOXAPARIN SODIUM 30 MG/0.3ML ~~LOC~~ SOLN: 30.0000 mg | SUBCUTANEOUS | Status: DC

## 2015-07-24 MED ORDER — ROCURONIUM BROMIDE 100 MG/10ML IV SOLN
INTRAVENOUS | Status: AC
  Filled 2015-07-24: qty 1

## 2015-07-24 MED ORDER — MORPHINE SULFATE (PF) 2 MG/ML IV SOLN
2.0000 mg | INTRAVENOUS | Status: DC | PRN
  Administered 2015-07-25: 2 mg via INTRAVENOUS
  Filled 2015-07-24: qty 1

## 2015-07-24 MED ORDER — EPHEDRINE SULFATE 50 MG/ML IJ SOLN
INTRAMUSCULAR | Status: AC
  Filled 2015-07-24: qty 1

## 2015-07-24 MED ORDER — MIDAZOLAM HCL 2 MG/2ML IJ SOLN
INTRAMUSCULAR | Status: AC
  Filled 2015-07-24: qty 4

## 2015-07-24 MED ORDER — HYDROMORPHONE HCL 1 MG/ML IJ SOLN
INTRAMUSCULAR | Status: DC | PRN
  Administered 2015-07-24 (×4): 0.5 mg via INTRAVENOUS

## 2015-07-24 MED ORDER — PROPOFOL 10 MG/ML IV BOLUS
INTRAVENOUS | Status: AC
  Filled 2015-07-24: qty 20

## 2015-07-24 SURGICAL SUPPLY — 45 items
ADH SKN CLS APL DERMABOND .7 (GAUZE/BANDAGES/DRESSINGS) ×1
BAG SPEC THK2 15X12 ZIP CLS (MISCELLANEOUS) ×1
BAG ZIPLOCK 12X15 (MISCELLANEOUS) ×3 IMPLANT
BANDAGE ELASTIC 4 VELCRO ST LF (GAUZE/BANDAGES/DRESSINGS) ×1 IMPLANT
BANDAGE ELASTIC 6 VELCRO ST LF (GAUZE/BANDAGES/DRESSINGS) ×1 IMPLANT
BNDG COHESIVE 4X5 TAN STRL (GAUZE/BANDAGES/DRESSINGS) ×1 IMPLANT
BNDG GAUZE ELAST 4 BULKY (GAUZE/BANDAGES/DRESSINGS) ×3 IMPLANT
CHLORAPREP W/TINT 26ML (MISCELLANEOUS) ×4 IMPLANT
COVER SURGICAL LIGHT HANDLE (MISCELLANEOUS) ×3 IMPLANT
CUFF TOURN SGL QUICK 34 (TOURNIQUET CUFF)
CUFF TRNQT CYL 34X4X40X1 (TOURNIQUET CUFF) ×1 IMPLANT
DERMABOND ADVANCED (GAUZE/BANDAGES/DRESSINGS) ×2
DERMABOND ADVANCED .7 DNX12 (GAUZE/BANDAGES/DRESSINGS) IMPLANT
DISSECTOR ROUND CHERRY 3/8 STR (MISCELLANEOUS) ×2 IMPLANT
DRAPE STERI 35X30 U-POUCH (DRAPES) ×4 IMPLANT
ELECT REM PT RETURN 9FT ADLT (ELECTROSURGICAL) ×3
ELECTRODE REM PT RTRN 9FT ADLT (ELECTROSURGICAL) ×1 IMPLANT
EVACUATOR 1/8 PVC DRAIN (DRAIN) IMPLANT
FACESHIELD WRAPAROUND (MASK) IMPLANT
FACESHIELD WRAPAROUND OR TEAM (MASK) ×2 IMPLANT
GAUZE SPONGE 4X4 12PLY STRL (GAUZE/BANDAGES/DRESSINGS) ×3 IMPLANT
GLOVE BIO SURGEON STRL SZ8.5 (GLOVE) ×1 IMPLANT
HANDPIECE INTERPULSE COAX TIP (DISPOSABLE)
IMMOBILIZER KNEE 20 (SOFTGOODS) ×2 IMPLANT
IV NS IRRIG 3000ML ARTHROMATIC (IV SOLUTION) ×1 IMPLANT
KIT BASIN OR (CUSTOM PROCEDURE TRAY) ×3 IMPLANT
LIGASURE 7.4 SM JAW OPEN (ELECTROSURGICAL) ×2 IMPLANT
LOOP VESSEL MINI RED (MISCELLANEOUS) IMPLANT
NS IRRIG 1000ML POUR BTL (IV SOLUTION) ×3 IMPLANT
PACK ORTHO EXTREMITY (CUSTOM PROCEDURE TRAY) ×3 IMPLANT
POSITIONER SURGICAL ARM (MISCELLANEOUS) ×3 IMPLANT
SET HNDPC FAN SPRY TIP SCT (DISPOSABLE) ×1 IMPLANT
SPONGE DRAIN TRACH 4X4 STRL 2S (GAUZE/BANDAGES/DRESSINGS) ×2 IMPLANT
SPONGE LAP 18X18 X RAY DECT (DISPOSABLE) ×3 IMPLANT
STOCKINETTE 8 INCH (MISCELLANEOUS) ×1 IMPLANT
SUT ETHILON 3 0 PS 1 (SUTURE) IMPLANT
SUT SILK 2 0 SH CR/8 (SUTURE) ×2 IMPLANT
SUT VIC AB 2-0 SH 18 (SUTURE) ×3 IMPLANT
SUT VIC AB 3-0 SH 27 (SUTURE) ×3
SUT VIC AB 3-0 SH 27X BRD (SUTURE) IMPLANT
SWAB COLLECTION DEVICE MRSA (MISCELLANEOUS) IMPLANT
TOWEL OR 17X26 10 PK STRL BLUE (TOWEL DISPOSABLE) ×3 IMPLANT
TUBE ANAEROBIC SPECIMEN COL (MISCELLANEOUS) ×1 IMPLANT
VESSEL LOOPS MINI RED (MISCELLANEOUS) ×6
WATER STERILE IRR 1500ML POUR (IV SOLUTION) ×3 IMPLANT

## 2015-07-24 NOTE — Interval H&P Note (Signed)
History and Physical Interval Note:  07/24/2015 8:24 AM  Tiffany Franklin  has presented today for surgery, with the diagnosis of SOFT TISSUE MASS  The various methods of treatment have been discussed with the patient and family. After consideration of risks, benefits and other options for treatment, the patient has consented to  Procedure(s): LEFT PROXIMAL MEDIAL THIGH EXCISION OF SOFT TISSUE MASS (Left) as a surgical intervention .  The patient's history has been reviewed, patient examined, no change in status, stable for surgery.  I have reviewed the patient's chart and labs.  Questions were answered to the patient's satisfaction.     Frederich Cha

## 2015-07-24 NOTE — H&P (View-Only) (Signed)
[facility] NOTE  Patient Care Team: Shon Baton, MD as PCP - General (Internal Medicine)  CHIEF COMPLAINTS/PURPOSE OF CONSULTATION:    HISTORY OF PRESENTING ILLNESS:  Tiffany Franklin 69 y.o.lady with a proximal medial soft tissue mass who returns today for review of her pathology following percutaneous biopsy targeting the solid component of the lesion.  I personally reviewed the case with the pathologist and Dr. Lisbeth Renshaw of Radiation oncology.   She has no new complaints and reports no complications of the biopsy.   I reviewed her records extensively and collaborated the history with the patient.  SUMMARY OF ONCOLOGIC HISTORY:   Breast cancer, left breast ([place]) (Resolved)   06/18/2015 Initial Diagnosis Breast cancer, left breast    MEDICAL HISTORY:  Past Medical History  Diagnosis Date  . Pneumonia     SURGICAL HISTORY: Past Surgical History  Procedure Laterality Date  . Abdominal hysterectomy      SOCIAL HISTORY: Social History   Social History  . Marital Status: Widowed    Spouse Name: N/A  . Number of Children: N/A  . Years of Education: N/A   Occupational History  . Not on file.   Social History Main Topics  . Smoking status: Never Smoker   . Smokeless tobacco: Not on file  . Alcohol Use: Yes     Comment: occ  . Drug Use: No  . Sexual Activity: Not on file   Other Topics Concern  . Not on file   Social History Narrative    FAMILY HISTORY: No family history on file.  ALLERGIES:  is allergic to demerol.  MEDICATIONS:  Current Outpatient Prescriptions  Medication Sig Dispense Refill  . acetaminophen (TYLENOL) 325 MG tablet Take 2 tablets (650 mg total) by mouth every 6 (six) hours as needed (or Fever >/= 101).    Marland Kitchen ALPRAZolam (XANAX) 0.25 MG tablet Take 0.25 mg by mouth at bedtime as needed.  3  . Ascorbic Acid (VITAMIN C) 100 MG tablet Take 100 mg by mouth every Monday, Wednesday, and Friday.     . B Complex-C (SUPER B  COMPLEX PO) Take 1 tablet by mouth every Monday, Wednesday, and Friday.     . cetirizine (ZYRTEC) 10 MG tablet Take 10 mg by mouth daily as needed for allergies.    . fluticasone (FLONASE) 50 MCG/ACT nasal spray Place 1 spray into both nostrils daily as needed for allergies or rhinitis.    . Fluticasone-Salmeterol (ADVAIR DISKUS) 250-50 MCG/DOSE AEPB Inhale 1 puff into the lungs 2 (two) times daily. (Patient taking differently: Inhale 1 puff into the lungs 2 (two) times daily as needed (when sick). ) 60 each 0  . MAGNESIUM PO Take 1 tablet by mouth every Monday, Wednesday, and Friday.     . naproxen sodium (ANAPROX) 220 MG tablet Take 220 mg by mouth daily as needed (pain.).    Marland Kitchen Polyethyl Glycol-Propyl Glycol (SYSTANE OP) Apply 1-2 drops to eye daily as needed (dry eyes).    . Probiotic Product (ALIGN) 4 MG CAPS Take 4 mg by mouth daily. (Patient taking differently: Take 4 mg by mouth daily as needed (when taking antibiotics). ) 14 capsule 0  . zolpidem (AMBIEN) 5 MG tablet Take 2.5 mg by mouth at bedtime as needed for sleep.   3   No current facility-administered medications for this visit.     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  I personally reviewed this with the pathologist and discussed the case with Dr. Lisbeth Renshaw of Radiation Oncology.    Plan:  Extended discussion of pathology and implications (over 30 min in length, all face to face).  Decision with patient is to move forward with radical resection of the Left Proximal medical soft tissue mass and further treatments to be based on final pathology.    The planned procedure was reviewed at length including discussion around the location being near the neurovascular structures and attendant risk of bleeding, permanent nerve injury with associated numbness, pain or weakness of the leg, as well as blood clot.  She understands that  she will likely have a drain in place and the risk of infection, fluid collection and need for further surgery are present.  She also understands that we will have her on anti-coagulation in the post-operative setting for a few weeks and that this will require injections. All questions were answered and we have her tentatively scheduled for [facility] 07/24/15.  Pre-operative anesthesia appointments will be set up and we will obtain a PA/Lat of Chest.       Frederich Cha, MD 3:30 PM

## 2015-07-24 NOTE — Anesthesia Procedure Notes (Signed)
Procedure Name: Intubation Date/Time: 07/24/2015 8:40 AM Performed by: Glory Buff Pre-anesthesia Checklist: Patient identified, Emergency Drugs available, Suction available and Patient being monitored Patient Re-evaluated:Patient Re-evaluated prior to inductionOxygen Delivery Method: Circle System Utilized Preoxygenation: Pre-oxygenation with 100% oxygen Intubation Type: IV induction Ventilation: Mask ventilation without difficulty Laryngoscope Size: Miller and 3 Grade View: Grade I Tube type: Oral Tube size: 7.0 mm Number of attempts: 1 Airway Equipment and Method: Stylet and Oral airway Placement Confirmation: ETT inserted through vocal cords under direct vision,  positive ETCO2 and breath sounds checked- equal and bilateral Secured at: 20 cm Tube secured with: Tape Dental Injury: Teeth and Oropharynx as per pre-operative assessment

## 2015-07-24 NOTE — Transfer of Care (Signed)
Immediate Anesthesia Transfer of Care Note  Patient: Tiffany Franklin  Procedure(s) Performed: Procedure(s): LEFT PROXIMAL MEDIAL THIGH EXCISION OF SOFT TISSUE MASS (Left)  Patient Location: PACU  Anesthesia Type:General  Level of Consciousness: awake, alert  and oriented  Airway & Oxygen Therapy: Patient Spontanous Breathing and Patient connected to face mask oxygen  Post-op Assessment: Report given to RN and Post -op Vital signs reviewed and stable  Post vital signs: Reviewed and stable  Last Vitals:  Filed Vitals:   07/24/15 0649  BP: 147/75  Pulse: 83  Temp: 36.6 C  Resp: 18    Complications: No apparent anesthesia complications

## 2015-07-24 NOTE — Anesthesia Preprocedure Evaluation (Addendum)
Anesthesia Evaluation  Patient identified by MRN, date of birth, ID band Patient awake    Reviewed: Allergy & Precautions, NPO status , Patient's Chart, lab work & pertinent test results  History of Anesthesia Complications Negative for: history of anesthetic complications  Airway Mallampati: II  TM Distance: >3 FB Neck ROM: Full    Dental  (+) Teeth Intact, Dental Advisory Given   Pulmonary neg pulmonary ROS,    Pulmonary exam normal breath sounds clear to auscultation       Cardiovascular Exercise Tolerance: Good negative cardio ROS Normal cardiovascular exam Rhythm:Regular Rate:Normal     Neuro/Psych PSYCHIATRIC DISORDERS Anxiety negative neurological ROS     GI/Hepatic negative GI ROS, Neg liver ROS,   Endo/Other  negative endocrine ROS  Renal/GU negative Renal ROS     Musculoskeletal  (+) Arthritis , Osteoarthritis,  Left thigh mass   Abdominal   Peds  Hematology negative hematology ROS (+)   Anesthesia Other Findings Day of surgery medications reviewed with the patient.  Reproductive/Obstetrics                             Anesthesia Physical Anesthesia Plan  ASA: II  Anesthesia Plan: General   Post-op Pain Management:    Induction: Intravenous  Airway Management Planned: Oral ETT  Additional Equipment:   Intra-op Plan:   Post-operative Plan: Extubation in OR  Informed Consent: I have reviewed the patients History and Physical, chart, labs and discussed the procedure including the risks, benefits and alternatives for the proposed anesthesia with the patient or authorized representative who has indicated his/her understanding and acceptance.   Dental advisory given  Plan Discussed with: CRNA  Anesthesia Plan Comments: (Risks/benefits of general anesthesia discussed with patient including risk of damage to teeth, lips, gum, and tongue, nausea/vomiting, allergic  reactions to medications, and the possibility of heart attack, stroke and death.  All patient questions answered.  Patient wishes to proceed.)        Anesthesia Quick Evaluation

## 2015-07-24 NOTE — Anesthesia Postprocedure Evaluation (Signed)
\    Anesthesia Post-op Note  Patient: Tiffany Franklin  Procedure(s) Performed: Procedure(s) (LRB): LEFT PROXIMAL MEDIAL THIGH EXCISION OF SOFT TISSUE MASS (Left)  Patient Location: PACU  Anesthesia Type: General  Level of Consciousness: awake and alert   Airway and Oxygen Therapy: Patient Spontanous Breathing  Post-op Pain: mild  Post-op Assessment: Post-op Vital signs reviewed, Patient's Cardiovascular Status Stable, Respiratory Function Stable, Patent Airway and No signs of Nausea or vomiting  Last Vitals:  Filed Vitals:   07/24/15 1513  BP: 119/57  Pulse: 70  Temp: 36.6 C  Resp: 18    Post-op Vital Signs: stable   Complications: No apparent anesthesia complications

## 2015-07-24 NOTE — Brief Op Note (Signed)
07/24/2015  10:46 AM  PATIENT:  Tiffany Franklin  69 y.o. female  PRE-OPERATIVE DIAGNOSIS:  SOFT TISSUE MASS  POST-OPERATIVE DIAGNOSIS:  soft tissue mass  PROCEDURE:  Procedure(s): LEFT PROXIMAL MEDIAL THIGH EXCISION OF SOFT TISSUE MASS (Left)  SURGEON:  Surgeon(s) and Role:    * Hall Busing, MD - Primary  PHYSICIAN ASSISTANT:   ASSISTANTS:    ANESTHESIA:   general  EBL:  Total I/O In: 1000  Out: 120 [Urine:100; Blood:20]  BLOOD ADMINISTERED:none  DRAINS: (10 mm) Jackson-Pratt drain(s) with closed bulb suction in the Left medial proximal thigh   LOCAL MEDICATIONS USED:  MARCAINE     SPECIMEN:  Source of Specimen:  Left Medial Proximal Thigh Soft Tissue Mass  DISPOSITION OF SPECIMEN:  PATHOLOGY  COUNTS:  YES  TOURNIQUET:  * No tourniquets in log *  DICTATION: .Dragon Dictation  PLAN OF CARE: Admit for overnight observation  PATIENT DISPOSITION:  PACU - hemodynamically stable.   Delay start of Pharmacological VTE agent (>24hrs) due to surgical blood loss or risk of bleeding: yes

## 2015-07-24 NOTE — Op Note (Signed)
Pre-Op Dx:  Left medial proximal thigh soft tissue mass Post op diagnosis: Left medial proximal thigh soft tissue mass Procedure: Radical resection left medial proximal thigh soft tissue mass en bloc with portion of abductor longus and deep internal femoral artery. Surgeon: Tonye Becket, M.D. Anesthesia: Gen. endotracheal with local quarter percent Marcaine with epinephrine IV fluids: 1 L crystalloid Estimated blood loss: 20 mL Urine output: 100 mL Specimen: Left proximal medial thigh soft tissue mass with portion of abductor longus and deep internal femoral artery. Drains: 10 French Jackson-Pratt in left medial proximal thigh Indications for procedure: Patient is a 69 year old lady with a enlarging left proximal medial thigh soft tissue mass with nondiagnostic biopsy however there was a finding of myxoid material with spindle cells. The plan is to move forward with radical resection. Procedure in detail: After informed consent was obtained the patient was identified taken to the operating room room and placed in supine position. The patient was then prepped and draped status sterile fashion. Timeout was performed and then the area overlying the readily palpable mass in the left proximal medial thigh was infiltrated using local anesthetic. A longitudinal incision was made directly over the mass. Dissection carried down identifying the saphenous vein which is reflected posteriorly and preserved along its length. Dissection was carried down to the overlying fascia. Circumferential dissection was carried out identifying and preserving the femoral vein and femoral artery deep femoral vein throughout the procedure. The deep femoral vein was densely adherent to the deep anterior border but was able to be dissected free. The distal portion of the deep internal femoral artery was found directly extending into the tumor proper and was ligated using 2-0 silk suture with clips and divided. Further dissection  was able to completely remove the tumor. This included a portion of the abductor longus muscle. This was in order with marking sutures and sent for permanent. Inspection of the wound revealed no evidence of bleeding and was irrigated with copious amounts of warm water irrigation. The femoral artery was palpated and found to have an excellent pulse. The femoral vein and deep femoral branch were intact along their lengths as was the saphenous vein. Circumferential marking clips were placed in the resection bed to assist in postoperative adjuvant radiation planning if needed. A 10 [place] JP drain was brought out through a separate stab incision inferiorly in line with the primary incision and sutured in place using a 3-0 nylon suture. The wound was then closed using interrupted #1 Vicryl suture for the fascial layer followed by 2 layers of interrupted 3-0 Vicryl suture and finally the skin edges were reapproximated using a running 4-0 Monocryl suture and sealed using Dermabond. A knee immobilizer was placed and at the end of the case sponge and needle counts were reported as correct 2. I was present and scrubbed for all key portions this operation. At the end of the case she had a strongly palpable dorsalis pedis and posterior tibial artery pulse.

## 2015-07-25 DIAGNOSIS — D2122 Benign neoplasm of connective and other soft tissue of left lower limb, including hip: Secondary | ICD-10-CM | POA: Diagnosis not present

## 2015-07-25 MED ORDER — OXYCODONE-ACETAMINOPHEN 5-325 MG PO TABS: 1.0000 | ORAL_TABLET | ORAL | Status: DC | PRN

## 2015-07-25 MED ORDER — ENOXAPARIN SODIUM 40 MG/0.4ML ~~LOC~~ SOLN
40.0000 mg | SUBCUTANEOUS | Status: DC
Start: 2015-07-25 — End: 2017-12-12

## 2015-07-25 NOTE — Discharge Instructions (Signed)
07/25/2015  Return to work:   Activity: 1. Keep leg elevated when at rest.  Full weight bearing.     2. No driving until cleared in clinic.  Do not drive if you are taking narcotic pain medicine.  3. Sponge bathe only until drain removed.  Use soap and water on your incision and pat dry; don't rub.    4. You may experience a small amount of clear drainage from your incisions, which is normal.  If the drainage persists or increases, please call the office.   Diet: Regular.  2. It is safe to use a laxative, such as Miralax or Colace, if you have difficulty moving your bowels.   Wound Care: 1. Keep clean and dry.    Reasons to call the Doctor:  Fever - Oral temperature greater than 100.4 degrees Fahrenheit  Foul-smelling discharge  Difficulty urinating  Nausea and vomiting  Increased pain at the site of the incision that is unrelieved with pain medicine.  Difficulty breathing with or without chest pain  New calf pain especially if only on one side    Contacts: For questions or concerns you should contact:  Dr. Frederich Cha [phone]  Joylene John, NP at [phone]  After Hours: call [phone]   How and Where to Give Subcutaneous Enoxaparin Injections Enoxaparin is an injectable medicine. It is used to help prevent blood clots from developing in your veins. Health care providers often use anticoagulants like enoxaparin to prevent clots following surgery. Enoxaparin is also used in combination with other medicines to treat blood clots and heart attacks. If blood clots are left untreated, they can be life threatening.  Enoxaparin comes in single-use syringes. You inject enoxaparin through a syringe into your belly (abdomen). You should change the injection site each time you give yourself a shot. Continue the enoxaparin injections as directed by your health care provider. Your health care provider will use blood clotting test results to decide when you can safely  stop using enoxaparin injections. If your health care provider prescribes any additional medicines, use the medicines exactly as directed. HOW DO I INJECT ENOXAPARIN?   Wash your hands with soap and water.  Clean the selected injection site as directed by your health care provider.  Remove the needle cap by pulling it straight off the syringe.  Hold the syringe like a pencil using your writing hand.  Use your other hand to pinch and hold an inch of the cleansed skin.  Insert the entire needle straight down into the fold of skin.  Push the plunger with your thumb until the syringe is empty.  Pull the needle straight out of your skin.  Enoxaparin injection prefilled syringes and graduated prefilled syringes are available with a system that shields the needle after injection. After you have completed your injection and removed the needle from your skin, firmly push down on the plunger. The protective sleeve will automatically cover the needle and you will hear a click. The click means the needle is safely covered.  Place the syringe in the nearest needle box, also called a sharps container. If you do not have a sharps container, you can use a hard-sided plastic container with a secure lid, such as an empty laundry detergent bottle. WHAT ELSE DO I NEED TO KNOW?  Do not use enoxaparin if:  You have allergies to heparin or pork products.  You have been diagnosed with a condition called thrombocytopenia.  Do not use the syringe or needle more than one  time.  Use medicines only as directed by your health care provider.  Changes in medicines, supplements, diet, and illness can affect your anticoagulation therapy. Be sure to inform your health care provider of any of these changes.  It is important that you tell all of your health care providers and your dentist that you are taking an anticoagulant, especially if you are injured or plan to have any type of procedure.  While on  anticoagulants, you will need to have blood tests done routinely as directed by your health care provider.  While using this medicine, avoid physical activities or sports that could result in a fall or cause injury.  Follow up with your laboratory test and health care provider appointments as directed. It is very important to keep your appointments. Not keeping appointments could result in a chronic or permanent injury, pain, or disability.  Before giving your medicine, you should make sure the injection is a clear and colorless or pale yellow solution. If your medicine becomes discolored or if there are particles in the syringe, do not use it and notify your health care provider.  Keep your medicine safely stored at room temperature. SEEK MEDICAL CARE IF:  You develop any rashes on your skin.  You have large areas of bruising on your skin.  You have any worsening of the condition for which you take Enoxaparin.  You develop a fever. SEEK IMMEDIATE MEDICAL CARE IF:  You develop bleeding problems such as:  Bleeding from the gums or nose that does not stop quickly.  Vomiting blood or coughing up blood.  Blood in your urine.  Blood in your stool, or stool that has a dark, tarry, or coffee grounds appearance.  A cut that does not stop bleeding within 10 minutes. These symptoms may represent a serious problem that is an emergency. Do not wait to see if the symptoms will go away. Get medical help right away. Call your local emergency services (911 in the [place] Do not drive yourself to the hospital.    This information is not intended to replace advice given to you by your health care provider. Make sure you discuss any questions you have with your health care provider.   Document Released: 07/23/2004 Document Revised: 10/12/2014 Document Reviewed: 03/08/2014 Elsevier Interactive Patient Education 2016 [employer] A bulb drain consists of a thin rubber tube  and a soft, round bulb that creates a gentle suction. The rubber tube is placed in the area where you had surgery. A bulb is attached to the end of the tube that is outside the body. The bulb drain removes excess fluid that normally builds up in a surgical wound after surgery. The color and amount of fluid will vary. Immediately after surgery, the fluid is bright red and is a little thicker than water. It may gradually change to a yellow or pink color and become more thin and water-like. When the amount decreases to about 1 or 2 tbsp in 24 hours, your health care provider will usually remove it. DAILY CARE  Keep the bulb flat (compressed) at all times, except while emptying it. The flatness creates suction. You can flatten the bulb by squeezing it firmly in the middle and then closing the cap.  Keep sites where the tube enters the skin dry and covered with a bandage (dressing).  Secure the tube 1-2 in (2.5-5.1 cm) below the insertion sites to keep it from pulling on your stitches. The tube is  stitched in place and will not slip out.  Secure the bulb as directed by your health care provider.  For the first 3 days after surgery, there usually is more fluid in the bulb. Empty the bulb whenever it becomes half full because the bulb does not create enough suction if it is too full. The bulb could also overflow. Write down how much fluid you remove each time you empty your drain. Add up the amount removed in 24 hours.  Empty the bulb at the same time every day once the amount of fluid decreases and you only need to empty it once a day. Write down the amounts and the 24-hour totals to give to your health care provider. This helps your health care provider know when the tubes can be removed. EMPTYING THE BULB DRAIN Before emptying the bulb, get a measuring cup, a piece of paper and a pen, and wash your hands.  Gently run your fingers down the tube (stripping) to empty any drainage from the tubing into the  bulb. This may need to be done several times a day to clear the tubing of clots and tissue.  Open the bulb cap to release suction, which causes it to inflate. Do not touch the inside of the cap.  Gently run your fingers down the tube (stripping) to empty any drainage from the tubing into the bulb.  Hold the cap out of the way, and pour fluid into the measuring cup.   Squeeze the bulb to provide suction.  Replace the cap.   Check the tape that holds the tube to your skin. If it is becoming loose, you can remove the loose piece of tape and apply a new one. Then, pin the bulb to your shirt.   Write down the amount of fluid you emptied out. Write down the date and each time you emptied your bulb drain. (If there are 2 bulbs, note the amount of drainage from each bulb and keep the totals separate. Your health care provider will want to know the total amounts for each drain and which tube is draining more.)   Flush the fluid down the toilet and wash your hands.   Call your health care provider once you have less than 2 tbsp of fluid collecting in the bulb drain every 24 hours. If there is drainage around the tube site, change dressings and keep the area dry. Cleanse around tube with sterile saline and place dry gauze around site. This gauze should be changed when it is soiled. If it stays clean and unsoiled, it should still be changed daily.  SEEK MEDICAL CARE IF:  Your drainage has a bad smell or is cloudy.   You have a fever.   Your drainage is increasing instead of decreasing.   Your tube fell out.   You have redness or swelling around the tube site.   You have drainage from a surgical wound.   Your bulb drain will not stay flat after you empty it.  MAKE SURE YOU:   Understand these instructions.  Will watch your condition.  Will get help right away if you are not doing well or get worse.   This information is not intended to replace advice given to you by your  health care provider. Make sure you discuss any questions you have with your health care provider.   Document Released: 09/18/2000 Document Revised: 10/12/2014 Document Reviewed: 04/10/2015 Elsevier Interactive Patient Education 2016 [employer]  Acetaminophen; Oxycodone tablets What is  this medicine? ACETAMINOPHEN; OXYCODONE (a set a MEE noe fen; ox i KOE done) is a pain reliever. It is used to treat moderate to severe pain. This medicine may be used for other purposes; ask your health care provider or pharmacist if you have questions. What should I tell my health care provider before I take this medicine? They need to know if you have any of these conditions: -brain tumor -Crohn's disease, inflammatory bowel disease, or ulcerative colitis -drug abuse or addiction -head injury -heart or circulation problems -if you often drink alcohol -kidney disease or problems going to the bathroom -liver disease -lung disease, asthma, or breathing problems -an unusual or allergic reaction to acetaminophen, oxycodone, other opioid analgesics, other medicines, foods, dyes, or preservatives -pregnant or trying to get pregnant -breast-feeding How should I use this medicine? Take this medicine by mouth with a full glass of water. Follow the directions on the prescription label. You can take it with or without food. If it upsets your stomach, take it with food. Take your medicine at regular intervals. Do not take it more often than directed. Talk to your pediatrician regarding the use of this medicine in children. Special care may be needed. Patients over 23 years old may have a stronger reaction and need a smaller dose. Overdosage: If you think you have taken too much of this medicine contact a poison control center or emergency room at once. NOTE: This medicine is only for you. Do not share this medicine with others. What if I miss a dose? If you miss a dose, take it as soon as you can. If it is  almost time for your next dose, take only that dose. Do not take double or extra doses. What may interact with this medicine? -alcohol -antihistamines -barbiturates like amobarbital, butalbital, butabarbital, methohexital, pentobarbital, phenobarbital, thiopental, and secobarbital -benztropine -drugs for bladder problems like solifenacin, trospium, oxybutynin, tolterodine, hyoscyamine, and methscopolamine -drugs for breathing problems like ipratropium and tiotropium -drugs for certain stomach or intestine problems like propantheline, homatropine methylbromide, glycopyrrolate, atropine, belladonna, and dicyclomine -general anesthetics like etomidate, ketamine, nitrous oxide, propofol, desflurane, enflurane, halothane, isoflurane, and sevoflurane -medicines for depression, anxiety, or psychotic disturbances -medicines for sleep -muscle relaxants -naltrexone -narcotic medicines (opiates) for pain -phenothiazines like perphenazine, thioridazine, chlorpromazine, mesoridazine, fluphenazine, prochlorperazine, promazine, and trifluoperazine -scopolamine -tramadol -trihexyphenidyl This list may not describe all possible interactions. Give your health care provider a list of all the medicines, herbs, non-prescription drugs, or dietary supplements you use. Also tell them if you smoke, drink alcohol, or use illegal drugs. Some items may interact with your medicine. What should I watch for while using this medicine? Tell your doctor or health care professional if your pain does not go away, if it gets worse, or if you have new or a different type of pain. You may develop tolerance to the medicine. Tolerance means that you will need a higher dose of the medication for pain relief. Tolerance is normal and is expected if you take this medicine for a long time. Do not suddenly stop taking your medicine because you may develop a severe reaction. Your body becomes used to the medicine. This does NOT mean you are  addicted. Addiction is a behavior related to getting and using a drug for a non-medical reason. If you have pain, you have a medical reason to take pain medicine. Your doctor will tell you how much medicine to take. If your doctor wants you to stop the medicine, the dose will be  slowly lowered over time to avoid any side effects. You may get drowsy or dizzy. Do not drive, use machinery, or do anything that needs mental alertness until you know how this medicine affects you. Do not stand or sit up quickly, especially if you are an older patient. This reduces the risk of dizzy or fainting spells. Alcohol may interfere with the effect of this medicine. Avoid alcoholic drinks. There are different types of narcotic medicines (opiates) for pain. If you take more than one type at the same time, you may have more side effects. Give your health care provider a list of all medicines you use. Your doctor will tell you how much medicine to take. Do not take more medicine than directed. Call emergency for help if you have problems breathing. The medicine will cause constipation. Try to have a bowel movement at least every 2 to 3 days. If you do not have a bowel movement for 3 days, call your doctor or health care professional. Do not take Tylenol (acetaminophen) or medicines that have acetaminophen with this medicine. Too much acetaminophen can be very dangerous. Many nonprescription medicines contain acetaminophen. Always read the labels carefully to avoid taking more acetaminophen. What side effects may I notice from receiving this medicine? Side effects that you should report to your doctor or health care professional as soon as possible: -allergic reactions like skin rash, itching or hives, swelling of the face, lips, or tongue -breathing difficulties, wheezing -confusion -light headedness or fainting spells -severe stomach pain -unusually weak or tired -yellowing of the skin or the whites of the eyes Side  effects that usually do not require medical attention (report to your doctor or health care professional if they continue or are bothersome): -dizziness -drowsiness -nausea -vomiting This list may not describe all possible side effects. Call your doctor for medical advice about side effects. You may report side effects to FDA at 1-800-FDA-1088. Where should I keep my medicine? Keep out of the reach of children. This medicine can be abused. Keep your medicine in a safe place to protect it from theft. Do not share this medicine with anyone. Selling or giving away this medicine is dangerous and against the law. This medicine may cause accidental overdose and death if it taken by other adults, children, or pets. Mix any unused medicine with a substance like cat litter or coffee grounds. Then throw the medicine away in a sealed container like a sealed bag or a coffee can with a lid. Do not use the medicine after the expiration date. Store at room temperature between 20 and 25 degrees C (68 and 77 degrees F). NOTE: This sheet is a summary. It may not cover all possible information. If you have questions about this medicine, talk to your doctor, pharmacist, or health care provider.    2016, Elsevier/[employer] Standard. (2014-08-22 15:18:46)

## 2015-07-25 NOTE — Discharge Summary (Signed)
Physician Discharge Summary  Patient ID: Tiffany Franklin MRN: [id number] DOB/AGE: Feb 09, 1946 69 y.o.  Admit date: 07/24/2015 Discharge date: 07/25/2015  Admission Diagnoses:  Left Proximal Medial Soft Tissue Mass  Discharge Diagnoses:  Principal Problem:   Soft tissue mass   Discharged Condition: stable  Hospital Course: No significant events.  Good Pain control overnight.  Plan to have evaluation and crutch training by PT and catheter removed, then discharged home.  Consults: None  Significant Diagnostic Studies: None  Treatments: surgery: Resection of Left Proximal Medial thigh soft tissue mass.  Discharge Exam: Blood pressure 107/49, pulse 57, temperature 98.2 F (36.8 C), temperature source Oral, resp. rate 18, height 5\' 6"  (1.676 m), weight 60.328 kg (133 lb), SpO2 100 %. Incision/Wound: C/D without erythema/warmth or discharge.  N/V intact.  Disposition: 01-Home or Self Care  Discharge Instructions    Call MD for:  redness, tenderness, or signs of infection (pain, swelling, redness, odor or green/yellow discharge around incision site)    Complete by:  As directed      Call MD for:  severe uncontrolled pain    Complete by:  As directed      Call MD for:  temperature >100.4    Complete by:  As directed      Diet - low sodium heart healthy    Complete by:  As directed      Discharge instructions    Complete by:  As directed   Record drain output daily.  Call office with daily outputs.     Discontinue IV    Complete by:  As directed      Increase activity slowly    Complete by:  As directed   Full weight bearing.   No driving until cleared in clinic or while on pain medication.            Medication List    TAKE these medications        cetirizine 10 MG tablet  Commonly known as:  ZYRTEC  Take 10 mg by mouth daily as needed for allergies.     enoxaparin 40 MG/0.4ML injection  Commonly known as:  LOVENOX  Inject 0.4 mLs (40 mg total) into the skin  daily.     fluticasone 50 MCG/ACT nasal spray  Commonly known as:  FLONASE  Place 1 spray into both nostrils daily as needed for allergies or rhinitis.     oxyCODONE-acetaminophen 5-325 MG tablet  Commonly known as:  PERCOCET/ROXICET  Take 1-2 tablets by mouth every 4 (four) hours as needed for moderate pain.     zolpidem 5 MG tablet  Commonly known as:  AMBIEN  Take 2.5 mg by mouth at bedtime as needed for sleep.           Follow-up Information    Call Frederich Cha, MD.   Specialty:  General Surgery   Contact information:   [place] [address] [phone]       Signed: Frederich Cha 07/25/2015, 9:11 AM

## 2015-07-25 NOTE — Evaluation (Addendum)
Physical Therapy Evaluation Patient Details Name: Tiffany Franklin MRN: [id number] DOB: [birth year] Today's Date: 07/25/2015   History of Present Illness  69 y.o. female admitted for excision of L thigh mass.   Clinical Impression  Pt admitted with above diagnosis. . Pt independently ambulated 350' with RW and 19' with hand held assist, She reported minimal pain with walking and had no loss of balance. Instructed pt in LLE ROM exercises. Pt plans to borrow RW from cancer center. She can use RW initially, then wean to a cane.  She is ready to DC home from PT standpoint.      Follow Up Recommendations No PT follow up    Equipment Recommendations  Rolling walker with 5" wheels (pt to borrow RW from cancer center)    Recommendations for Other Services       Precautions / Restrictions Precautions Precautions: None Precaution Comments: pt denies h/o falls Restrictions Weight Bearing Restrictions: No Other Position/Activity Restrictions: FWB LLE      Mobility  Bed Mobility Overal bed mobility: Modified Independent             General bed mobility comments: instructed pt to self assist LLE with RLE for OOB  Transfers Overall transfer level: Needs assistance Equipment used: Rolling walker (2 wheeled) Transfers: Sit to/from Stand Sit to Stand: Supervision         General transfer comment: verbal cues for hand placement  Ambulation/Gait Ambulation/Gait assistance: Modified independent (Device/Increase time) Ambulation Distance (Feet): 400 Feet Assistive device: Rolling walker (2 wheeled) Gait Pattern/deviations: Step-through pattern   Gait velocity interpretation: at or above normal speed for age/gender General Gait Details: steady with RW, pt reported 5/10 burning pain L thigh with walking initially, she stated it resolved after walking a bit, walked 350' with RW, then 36' with HHA without LOB  Stairs            Wheelchair Mobility    Modified Rankin  (Stroke Patients Only)       Balance Overall balance assessment: Modified Independent                                           Pertinent Vitals/Pain Pain Assessment: 0-10 Pain Score: 5  Pain Location: L thigh incision with walking Pain Descriptors / Indicators: Burning Pain Intervention(s): Monitored during session;Premedicated before session;Limited activity within patient's tolerance    Home Living Family/patient expects to be discharged to:: Private residence Living Arrangements: Alone Available Help at Discharge: Friend(s);Family;Available 24 hours/day Type of Home: House Home Access: Stairs to enter   [employer] of Steps: 2 Home Layout: Two level;Able to live on main level with bedroom/bathroom Home Equipment: None      Prior Function Level of Independence: Independent         Comments: very active PTA     Hand Dominance        Extremity/Trunk Assessment   Upper Extremity Assessment: Overall WFL for tasks assessed           Lower Extremity Assessment: LLE deficits/detail (L knee extension at least 3/5, L SLR -3/5, L ankle WNL)      Cervical / Trunk Assessment: Normal  Communication   Communication: No difficulties  Cognition Arousal/Alertness: Awake/alert Behavior During Therapy: WFL for tasks assessed/performed Overall Cognitive Status: Within Functional Limits for tasks assessed  General Comments      Exercises General Exercises - Lower Extremity Long Arc Quad: AROM;Left;10 reps;Seated Heel Slides: AAROM;Left;5 reps;Supine Hip ABduction/ADduction: AAROM;Left;5 reps;Supine      Assessment/Plan    PT Assessment Patent does not need any further PT services  PT Diagnosis Acute pain   PT Problem List    PT Treatment Interventions     PT Goals (Current goals can be found in the Care Plan section) Acute Rehab PT Goals Patient Stated Goal: yoga, gardening, walking PT Goal  Formulation: All assessment and education complete, DC therapy Time For Goal Achievement: 07/25/15 Potential to Achieve Goals: Good    Frequency     Barriers to discharge        Co-evaluation               End of Session Equipment Utilized During Treatment: Gait belt Activity Tolerance: Patient tolerated treatment well Patient left: in chair;with call bell/phone within reach;with family/visitor present Nurse Communication: Mobility status         Time: 0915-0939 PT Time Calculation (min) (ACUTE ONLY): 24 min   Charges:   PT Evaluation $Initial PT Evaluation Tier I: 1 Procedure PT Treatments $Gait Training: 8-22 mins   PT G Codes:  PT G-Codes **NOT FOR INPATIENT CLASS** Functional Assessment Tool Used clinical judgement clinical judgement at 0946 on 07/25/15 by Lucile Crater, PT Functional Limitation Mobility: Walking and moving around Mobility: Walking and moving around at 0946 on 07/25/15 by Lucile Crater, PT Mobility: Walking and Moving Around Current Status [phone]) At least 1 percent but less than 20 percent impaired, limited or restricted CI at 0946 on 07/25/15 by Lucile Crater, PT Mobility: Walking and Moving Around Goal Status [phone]) At least 1 percent but less than 20 percent impaired, limited or restricted CI at 0946 on 07/25/15 by Lucile Crater, PT Mobility: Walking and Moving Around Discharge Status [phone]) At least 1 percent but less than 20 percent impaired, limited or restricted       Philomena Doheny 07/25/2015, 9:46 AM [phone]

## 2015-07-25 NOTE — Progress Notes (Signed)
Pt able to demonstrate how to empty drain and voice how to give lovenox. Pt has supplies for drain and walker and brace. Voided 300cc and states feels is able to empty bladder.  Discussed discharge instructions  Until no further questions ask. Discharged via wc to private car.

## 2015-07-25 NOTE — Care Management Note (Signed)
Case Management Note  Patient Details  Name: Tiffany Franklin MRN: [id number] Date of Birth: 1945/10/12  Subjective/Objective:      Radical resection left medial proximal thigh soft tissue mass             Action/Plan: Discharge planning, no needs identified, d/c home with family  Expected Discharge Date:                  Expected Discharge Plan:  Home/Self Care  In-House Referral:  NA  Discharge planning Services  CM Consult  Post Acute Care Choice:  NA Choice offered to:  NA  DME Arranged:  N/A DME Agency:  NA  HH Arranged:  NA HH Agency:  NA  Status of Service:  Completed, signed off  Medicare Important Message Given:    Date Medicare IM Given:    Medicare IM give by:    Date Additional Medicare IM Given:    Additional Medicare Important Message give by:     If discussed at [place] of Stay Meetings, dates discussed:    Additional Comments:  Guadalupe Maple, RN 07/25/2015, 12:37 PM

## 2015-07-26 ENCOUNTER — Telehealth: Payer: Self-pay | Admitting: Gynecologic Oncology

## 2015-07-26 NOTE — Telephone Encounter (Signed)
Patient called and reported JP output.  Doing well post-operatively at home.  No issues with administering the lovenox injections last pm.  Minimal pain reported.  On 10/20 at 6:15pm she had 20 cc of red, bloody output.  On 10/21 at 8 am, she had 30 cc of red bloody output.  No concerns voiced. Advised to call for any questions or concerns.

## 2015-07-29 ENCOUNTER — Telehealth: Payer: Self-pay | Admitting: Gynecologic Oncology

## 2015-07-29 NOTE — Telephone Encounter (Signed)
Patient called this am with an update.  She states her weekend went well.  She is having minimal pain and moving around without any issues.  Reporting incision looks well with no erythema or drainage.  At 6:40pm on 10/21, she had 30 cc of red drainage from the [place].  07/27/15 she had 20 cc in the am and 10 cc in the pm.  On 10/23, she had 10 cc in the am and 10 cc in the pm.  She reports 10 cc this am.  No other concerns voiced.  Advised to call for any questions or concerns.  Dr. Rolanda Jay notified of the patient's output.

## 2015-07-30 ENCOUNTER — Ambulatory Visit (HOSPITAL_BASED_OUTPATIENT_CLINIC_OR_DEPARTMENT_OTHER): Payer: Self-pay | Admitting: General Surgery

## 2015-07-30 ENCOUNTER — Encounter: Payer: Self-pay | Admitting: General Surgery

## 2015-07-30 VITALS — BP 150/92 | HR 92 | Temp 98.0°F | Resp 18 | Ht 66.0 in | Wt 132.3 lb

## 2015-07-30 DIAGNOSIS — R2242 Localized swelling, mass and lump, left lower limb: Secondary | ICD-10-CM

## 2015-07-30 DIAGNOSIS — M7989 Other specified soft tissue disorders: Secondary | ICD-10-CM

## 2015-07-30 DIAGNOSIS — M799 Soft tissue disorder, unspecified: Secondary | ICD-10-CM

## 2015-07-30 NOTE — Assessment & Plan Note (Signed)
Assessment: The patient is here in routine follow-up status post radical resection of left proximal medial thigh soft tissue mass. She has done well without any specific complaints. She is still taking her Lovenox injections. Her drain put has trended down to around 10 mL per day. Her drain was removed today in the office without difficulty. On examination with a female chaperone in the room her incision is clean dry without erythema warmth or discharge. She is now cleared to take showers and pat the wound dry. Wound care instructions were provided. I discussed the preliminary diagnosis of low-grade myxoid fibrosarcoma (personal communication with pathologist). The specimen has been sent out for second opinion and that final report is pending at the present time. Plan: We will refer the patient to Dr. Kyung Rudd (radiation oncology) for evaluation for adjuvant radiation therapy. On the day of her follow-up visit I will see her again for wound check and final review of her pathology report. She understands and agrees with the plan as outlined above. All questions were answered to her satisfaction. She was instructed to contact us if she were to have any concerns or change in her wound.

## 2015-07-30 NOTE — Patient Instructions (Signed)
Plan to meet with Dr. Lisbeth Renshaw in Radiation Oncology on.  We will contact you with the final pathology as well.  Please call for any questions or concerns.

## 2015-07-31 ENCOUNTER — Telehealth: Payer: Self-pay | Admitting: Gynecologic Oncology

## 2015-07-31 NOTE — Telephone Encounter (Signed)
Returned call to patient.  Patient had called the office asking when she could drive.  Spoke with Dr. Cammie Sickle about the situation.  Patient advised that she cannot drive if she is taking narcotics.  It would be her judgement but she would need to feel that her reaction time was where it was pre-operatively.  Verbalizing understanding.  She is advised to call for any questions or concerns.

## 2015-08-01 ENCOUNTER — Encounter: Payer: Self-pay | Admitting: Radiation Oncology

## 2015-08-01 NOTE — Progress Notes (Signed)
Mass  Left Inner Thigh since January  2011 followed with [facility] 3/11 & 5/11, 7/11 had bx showed likely cyst with malignancy Hers now  Mass increased in size    07/24/15: Path Pending  Sent to  [facility] for second opinion,   Pre-Op Dx: Left medial proximal thigh soft tissue mass Post op diagnosis: Left medial proximal thigh soft tissue mass Procedure: Radical resection left medial proximal thigh soft tissue mass en bloc with portion of abductor longus and deep internal femoral artery. Surgeon: Tonye Becket, M.D.  Diagnosis 07/04/15:  Soft Tissue Needle Core Biopsy, left thigh mass - SPINDLE CELL PROLIFERATION WITH MYXOID FEATURES, SEE COMMENT.     Widowed, never  Smoker, alcohol , social occasions, no drug use\hx pneumonia Father lung cancer,smoker, Mother deceased , one sister gastric cancer,living,   Allergies:Demerol,

## 2015-08-02 ENCOUNTER — Telehealth: Payer: Self-pay | Admitting: General Surgery

## 2015-08-02 ENCOUNTER — Other Ambulatory Visit: Payer: Self-pay | Admitting: Gynecologic Oncology

## 2015-08-02 NOTE — Telephone Encounter (Signed)
I was contacted by Dr. Donato Heinz of pathology and the final pathology will be read out as a benign myxoma. I spoke with Ms. Starkman and reviewed these findings.  As such, we will cancel her referral to Dr. Lisbeth Renshaw and I will review her pathology at our f/u clinic visit.

## 2015-08-02 NOTE — Progress Notes (Signed)
Cancelled radiation oncology appt due to benign pathology.

## 2015-08-05 ENCOUNTER — Ambulatory Visit
Admission: RE | Admit: 2015-08-05 | Discharge: 2015-08-05 | Disposition: A | Discharge status: Home / Self Care | Payer: PPO | Source: Ambulatory Visit | Attending: Radiation Oncology | Admitting: Radiation Oncology

## 2015-08-05 ENCOUNTER — Ambulatory Visit: Payer: PPO

## 2015-08-05 HISTORY — DX: Allergy, unspecified, initial encounter: T78.40XA

## 2015-08-05 HISTORY — DX: Malignant (primary) neoplasm, unspecified: C80.1

## 2015-08-06 ENCOUNTER — Encounter (HOSPITAL_COMMUNITY): Payer: Self-pay

## 2015-08-06 ENCOUNTER — Ambulatory Visit (HOSPITAL_BASED_OUTPATIENT_CLINIC_OR_DEPARTMENT_OTHER): Payer: PPO | Admitting: General Surgery

## 2015-08-06 ENCOUNTER — Encounter: Payer: Self-pay | Admitting: General Surgery

## 2015-08-06 VITALS — BP 133/55 | Temp 97.8°F | Resp 18

## 2015-08-06 DIAGNOSIS — M7989 Other specified soft tissue disorders: Secondary | ICD-10-CM

## 2015-08-06 DIAGNOSIS — D219 Benign neoplasm of connective and other soft tissue, unspecified: Secondary | ICD-10-CM

## 2015-08-06 NOTE — Assessment & Plan Note (Signed)
A:  Patient healing well.  No significant complaints. P:  Pathology shows benign myxoma.  No further treatment or specific follow-up needed.   She will follow-up on an as need basis from this point further.

## 2015-08-06 NOTE — Progress Notes (Signed)
I had the pleasure of seeing Tiffany Franklin in follow-up status post radical resection of left lower extremity soft tissue mass. On examination with a female chaperone in the room, her incision is clean dry without erythema warmth or discharge.  Copies of her operative and pathology reports were provided to her. 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 She understands this is a benign condition and resection alone is all that is required. All questions were answered to her satisfaction. She was advised that she can begin increasing activities as tolerated. She'll finish out her last dose of Lovenox today.  She will follow-up with me on an as-needed basis from this point forward.

## 2015-10-08 ENCOUNTER — Other Ambulatory Visit: Payer: Self-pay

## 2015-10-08 DIAGNOSIS — Z1231 Encounter for screening mammogram for malignant neoplasm of breast: Secondary | ICD-10-CM

## 2015-10-29 ENCOUNTER — Ambulatory Visit
Admission: RE | Admit: 2015-10-29 | Discharge: 2015-10-29 | Disposition: A | Discharge status: Home / Self Care | Payer: PPO | Source: Ambulatory Visit

## 2015-10-29 DIAGNOSIS — Z1231 Encounter for screening mammogram for malignant neoplasm of breast: Secondary | ICD-10-CM | POA: Diagnosis not present

## 2015-10-30 DIAGNOSIS — Z0142 Encounter for cervical smear to confirm findings of recent normal smear following initial abnormal smear: Secondary | ICD-10-CM | POA: Diagnosis not present

## 2015-10-30 DIAGNOSIS — Z01419 Encounter for gynecological examination (general) (routine) without abnormal findings: Secondary | ICD-10-CM | POA: Diagnosis not present

## 2015-11-27 DIAGNOSIS — Z0000 Encounter for general adult medical examination without abnormal findings: Secondary | ICD-10-CM | POA: Diagnosis not present

## 2015-11-27 DIAGNOSIS — N390 Urinary tract infection, site not specified: Secondary | ICD-10-CM | POA: Diagnosis not present

## 2015-11-27 DIAGNOSIS — R8290 Unspecified abnormal findings in urine: Secondary | ICD-10-CM | POA: Diagnosis not present

## 2015-12-05 DIAGNOSIS — G4709 Other insomnia: Secondary | ICD-10-CM | POA: Diagnosis not present

## 2015-12-05 DIAGNOSIS — Z0000 Encounter for general adult medical examination without abnormal findings: Secondary | ICD-10-CM | POA: Diagnosis not present

## 2015-12-05 DIAGNOSIS — Z1389 Encounter for screening for other disorder: Secondary | ICD-10-CM | POA: Diagnosis not present

## 2015-12-05 DIAGNOSIS — F418 Other specified anxiety disorders: Secondary | ICD-10-CM | POA: Diagnosis not present

## 2015-12-05 DIAGNOSIS — N951 Menopausal and female climacteric states: Secondary | ICD-10-CM | POA: Diagnosis not present

## 2015-12-05 DIAGNOSIS — R1909 Other intra-abdominal and pelvic swelling, mass and lump: Secondary | ICD-10-CM | POA: Diagnosis not present

## 2015-12-05 DIAGNOSIS — Z6821 Body mass index (BMI) 21.0-21.9, adult: Secondary | ICD-10-CM | POA: Diagnosis not present

## 2015-12-18 DIAGNOSIS — Z1212 Encounter for screening for malignant neoplasm of rectum: Secondary | ICD-10-CM | POA: Diagnosis not present

## 2016-03-19 DIAGNOSIS — R05 Cough: Secondary | ICD-10-CM | POA: Diagnosis not present

## 2016-03-19 DIAGNOSIS — J181 Lobar pneumonia, unspecified organism: Secondary | ICD-10-CM | POA: Diagnosis not present

## 2016-03-19 DIAGNOSIS — Z6821 Body mass index (BMI) 21.0-21.9, adult: Secondary | ICD-10-CM | POA: Diagnosis not present

## 2016-03-27 DIAGNOSIS — J181 Lobar pneumonia, unspecified organism: Secondary | ICD-10-CM | POA: Diagnosis not present

## 2016-04-22 DIAGNOSIS — J208 Acute bronchitis due to other specified organisms: Secondary | ICD-10-CM | POA: Diagnosis not present

## 2016-04-22 DIAGNOSIS — R938 Abnormal findings on diagnostic imaging of other specified body structures: Secondary | ICD-10-CM | POA: Diagnosis not present

## 2016-04-22 DIAGNOSIS — R5383 Other fatigue: Secondary | ICD-10-CM | POA: Diagnosis not present

## 2016-04-22 DIAGNOSIS — R0602 Shortness of breath: Secondary | ICD-10-CM | POA: Diagnosis not present

## 2016-04-22 DIAGNOSIS — R0789 Other chest pain: Secondary | ICD-10-CM | POA: Diagnosis not present

## 2016-04-22 DIAGNOSIS — R590 Localized enlarged lymph nodes: Secondary | ICD-10-CM | POA: Diagnosis not present

## 2016-04-22 DIAGNOSIS — R05 Cough: Secondary | ICD-10-CM | POA: Diagnosis not present

## 2016-04-22 DIAGNOSIS — R918 Other nonspecific abnormal finding of lung field: Secondary | ICD-10-CM | POA: Diagnosis not present

## 2016-05-05 DIAGNOSIS — R938 Abnormal findings on diagnostic imaging of other specified body structures: Secondary | ICD-10-CM | POA: Diagnosis not present

## 2016-05-05 DIAGNOSIS — R599 Enlarged lymph nodes, unspecified: Secondary | ICD-10-CM | POA: Diagnosis not present

## 2016-05-05 DIAGNOSIS — J189 Pneumonia, unspecified organism: Secondary | ICD-10-CM | POA: Diagnosis not present

## 2016-05-05 DIAGNOSIS — R0602 Shortness of breath: Secondary | ICD-10-CM | POA: Diagnosis not present

## 2016-05-05 DIAGNOSIS — R05 Cough: Secondary | ICD-10-CM | POA: Diagnosis not present

## 2016-05-12 DIAGNOSIS — H5203 Hypermetropia, bilateral: Secondary | ICD-10-CM | POA: Diagnosis not present

## 2016-05-12 DIAGNOSIS — H2513 Age-related nuclear cataract, bilateral: Secondary | ICD-10-CM | POA: Diagnosis not present

## 2016-06-15 DIAGNOSIS — J189 Pneumonia, unspecified organism: Secondary | ICD-10-CM | POA: Diagnosis not present

## 2016-06-15 DIAGNOSIS — Z23 Encounter for immunization: Secondary | ICD-10-CM | POA: Diagnosis not present

## 2016-10-06 ENCOUNTER — Other Ambulatory Visit: Payer: Self-pay | Admitting: Internal Medicine

## 2016-10-06 DIAGNOSIS — Z1231 Encounter for screening mammogram for malignant neoplasm of breast: Secondary | ICD-10-CM

## 2016-10-29 ENCOUNTER — Ambulatory Visit: Payer: PPO

## 2016-11-11 ENCOUNTER — Ambulatory Visit
Admission: RE | Admit: 2016-11-11 | Discharge: 2016-11-11 | Disposition: A | Discharge status: Home / Self Care | Payer: PPO | Source: Ambulatory Visit | Attending: Internal Medicine | Admitting: Internal Medicine

## 2016-11-11 DIAGNOSIS — Z1231 Encounter for screening mammogram for malignant neoplasm of breast: Secondary | ICD-10-CM

## 2017-02-01 DIAGNOSIS — N951 Menopausal and female climacteric states: Secondary | ICD-10-CM | POA: Diagnosis not present

## 2017-02-01 DIAGNOSIS — Z0000 Encounter for general adult medical examination without abnormal findings: Secondary | ICD-10-CM | POA: Diagnosis not present

## 2017-02-01 DIAGNOSIS — R358 Other polyuria: Secondary | ICD-10-CM | POA: Diagnosis not present

## 2017-02-01 DIAGNOSIS — G4709 Other insomnia: Secondary | ICD-10-CM | POA: Diagnosis not present

## 2017-02-08 DIAGNOSIS — R1909 Other intra-abdominal and pelvic swelling, mass and lump: Secondary | ICD-10-CM | POA: Diagnosis not present

## 2017-02-08 DIAGNOSIS — Z0000 Encounter for general adult medical examination without abnormal findings: Secondary | ICD-10-CM | POA: Diagnosis not present

## 2017-02-08 DIAGNOSIS — R05 Cough: Secondary | ICD-10-CM | POA: Diagnosis not present

## 2017-02-08 DIAGNOSIS — R748 Abnormal levels of other serum enzymes: Secondary | ICD-10-CM | POA: Diagnosis not present

## 2017-02-08 DIAGNOSIS — G4709 Other insomnia: Secondary | ICD-10-CM | POA: Diagnosis not present

## 2017-02-08 DIAGNOSIS — Z6821 Body mass index (BMI) 21.0-21.9, adult: Secondary | ICD-10-CM | POA: Diagnosis not present

## 2017-02-08 DIAGNOSIS — Z23 Encounter for immunization: Secondary | ICD-10-CM | POA: Diagnosis not present

## 2017-02-08 DIAGNOSIS — F419 Anxiety disorder, unspecified: Secondary | ICD-10-CM | POA: Diagnosis not present

## 2017-02-08 DIAGNOSIS — M1990 Unspecified osteoarthritis, unspecified site: Secondary | ICD-10-CM | POA: Diagnosis not present

## 2017-02-08 DIAGNOSIS — Z1389 Encounter for screening for other disorder: Secondary | ICD-10-CM | POA: Diagnosis not present

## 2017-02-08 DIAGNOSIS — N951 Menopausal and female climacteric states: Secondary | ICD-10-CM | POA: Diagnosis not present

## 2017-02-10 DIAGNOSIS — Z1212 Encounter for screening for malignant neoplasm of rectum: Secondary | ICD-10-CM | POA: Diagnosis not present

## 2017-03-08 DIAGNOSIS — Z6821 Body mass index (BMI) 21.0-21.9, adult: Secondary | ICD-10-CM | POA: Diagnosis not present

## 2017-03-08 DIAGNOSIS — R05 Cough: Secondary | ICD-10-CM | POA: Diagnosis not present

## 2017-03-08 DIAGNOSIS — J019 Acute sinusitis, unspecified: Secondary | ICD-10-CM | POA: Diagnosis not present

## 2017-03-08 DIAGNOSIS — R509 Fever, unspecified: Secondary | ICD-10-CM | POA: Diagnosis not present

## 2017-04-14 DIAGNOSIS — R1013 Epigastric pain: Secondary | ICD-10-CM | POA: Diagnosis not present

## 2017-04-14 DIAGNOSIS — R5383 Other fatigue: Secondary | ICD-10-CM | POA: Diagnosis not present

## 2017-04-14 DIAGNOSIS — R0789 Other chest pain: Secondary | ICD-10-CM | POA: Diagnosis not present

## 2017-04-14 DIAGNOSIS — Z79899 Other long term (current) drug therapy: Secondary | ICD-10-CM | POA: Diagnosis not present

## 2017-04-14 DIAGNOSIS — M2650 Dentofacial functional abnormalities, unspecified: Secondary | ICD-10-CM | POA: Diagnosis not present

## 2017-04-14 DIAGNOSIS — R509 Fever, unspecified: Secondary | ICD-10-CM | POA: Diagnosis not present

## 2017-04-14 DIAGNOSIS — K2960 Other gastritis without bleeding: Secondary | ICD-10-CM | POA: Diagnosis not present

## 2017-04-14 DIAGNOSIS — B9681 Helicobacter pylori [H. pylori] as the cause of diseases classified elsewhere: Secondary | ICD-10-CM | POA: Diagnosis not present

## 2017-04-14 DIAGNOSIS — R05 Cough: Secondary | ICD-10-CM | POA: Diagnosis not present

## 2017-05-07 DIAGNOSIS — R5382 Chronic fatigue, unspecified: Secondary | ICD-10-CM | POA: Diagnosis not present

## 2017-05-07 DIAGNOSIS — R0789 Other chest pain: Secondary | ICD-10-CM | POA: Diagnosis not present

## 2017-05-07 DIAGNOSIS — B9681 Helicobacter pylori [H. pylori] as the cause of diseases classified elsewhere: Secondary | ICD-10-CM | POA: Diagnosis not present

## 2017-05-07 DIAGNOSIS — B2790 Infectious mononucleosis, unspecified without complication: Secondary | ICD-10-CM | POA: Diagnosis not present

## 2017-05-07 DIAGNOSIS — E559 Vitamin D deficiency, unspecified: Secondary | ICD-10-CM | POA: Diagnosis not present

## 2017-05-22 DIAGNOSIS — R0789 Other chest pain: Secondary | ICD-10-CM | POA: Diagnosis not present

## 2017-06-09 DIAGNOSIS — Z1272 Encounter for screening for malignant neoplasm of vagina: Secondary | ICD-10-CM | POA: Diagnosis not present

## 2017-06-09 DIAGNOSIS — Z01419 Encounter for gynecological examination (general) (routine) without abnormal findings: Secondary | ICD-10-CM | POA: Diagnosis not present

## 2017-06-09 DIAGNOSIS — R87811 Vaginal high risk human papillomavirus (HPV) DNA test positive: Secondary | ICD-10-CM | POA: Diagnosis not present

## 2017-06-17 DIAGNOSIS — H5203 Hypermetropia, bilateral: Secondary | ICD-10-CM | POA: Diagnosis not present

## 2017-06-17 DIAGNOSIS — H2512 Age-related nuclear cataract, left eye: Secondary | ICD-10-CM | POA: Diagnosis not present

## 2017-07-27 DIAGNOSIS — H04123 Dry eye syndrome of bilateral lacrimal glands: Secondary | ICD-10-CM | POA: Diagnosis not present

## 2017-08-03 DIAGNOSIS — M5136 Other intervertebral disc degeneration, lumbar region: Secondary | ICD-10-CM | POA: Diagnosis not present

## 2017-08-03 DIAGNOSIS — M545 Low back pain: Secondary | ICD-10-CM | POA: Diagnosis not present

## 2017-08-03 DIAGNOSIS — M62830 Muscle spasm of back: Secondary | ICD-10-CM | POA: Diagnosis not present

## 2017-08-03 DIAGNOSIS — M461 Sacroiliitis, not elsewhere classified: Secondary | ICD-10-CM | POA: Diagnosis not present

## 2017-10-14 ENCOUNTER — Other Ambulatory Visit: Payer: Self-pay | Admitting: Internal Medicine

## 2017-10-14 DIAGNOSIS — Z1231 Encounter for screening mammogram for malignant neoplasm of breast: Secondary | ICD-10-CM

## 2017-11-15 ENCOUNTER — Ambulatory Visit
Admission: RE | Admit: 2017-11-15 | Discharge: 2017-11-15 | Disposition: A | Discharge status: Home / Self Care | Payer: PPO | Source: Ambulatory Visit | Attending: Internal Medicine | Admitting: Internal Medicine

## 2017-11-15 DIAGNOSIS — Z1231 Encounter for screening mammogram for malignant neoplasm of breast: Secondary | ICD-10-CM

## 2017-11-16 DIAGNOSIS — M1990 Unspecified osteoarthritis, unspecified site: Secondary | ICD-10-CM | POA: Diagnosis not present

## 2017-11-16 DIAGNOSIS — Z01818 Encounter for other preprocedural examination: Secondary | ICD-10-CM | POA: Diagnosis not present

## 2017-11-16 DIAGNOSIS — G4700 Insomnia, unspecified: Secondary | ICD-10-CM | POA: Diagnosis not present

## 2017-11-16 DIAGNOSIS — Z6821 Body mass index (BMI) 21.0-21.9, adult: Secondary | ICD-10-CM | POA: Diagnosis not present

## 2017-12-12 ENCOUNTER — Ambulatory Visit (INDEPENDENT_AMBULATORY_CARE_PROVIDER_SITE_OTHER): Payer: PPO

## 2017-12-12 ENCOUNTER — Other Ambulatory Visit: Payer: Self-pay

## 2017-12-12 ENCOUNTER — Encounter (HOSPITAL_COMMUNITY): Payer: Self-pay | Admitting: Emergency Medicine

## 2017-12-12 ENCOUNTER — Ambulatory Visit (HOSPITAL_COMMUNITY)
Admission: EM | Admit: 2017-12-12 | Discharge: 2017-12-12 | Disposition: A | Discharge status: Home / Self Care | Payer: PPO | Attending: Internal Medicine | Admitting: Internal Medicine

## 2017-12-12 DIAGNOSIS — R062 Wheezing: Secondary | ICD-10-CM

## 2017-12-12 DIAGNOSIS — R0602 Shortness of breath: Secondary | ICD-10-CM

## 2017-12-12 DIAGNOSIS — J189 Pneumonia, unspecified organism: Secondary | ICD-10-CM

## 2017-12-12 DIAGNOSIS — R05 Cough: Secondary | ICD-10-CM | POA: Diagnosis not present

## 2017-12-12 MED ORDER — IPRATROPIUM-ALBUTEROL 0.5-2.5 (3) MG/3ML IN SOLN
3.0000 mL | Freq: Once | RESPIRATORY_TRACT | Status: AC
  Administered 2017-12-12: 3 mL via RESPIRATORY_TRACT

## 2017-12-12 MED ORDER — IPRATROPIUM-ALBUTEROL 0.5-2.5 (3) MG/3ML IN SOLN
RESPIRATORY_TRACT | Status: AC
  Filled 2017-12-12: qty 3

## 2017-12-12 MED ORDER — ALBUTEROL SULFATE (2.5 MG/3ML) 0.083% IN NEBU: 2.5000 mg | INHALATION_SOLUTION | Freq: Four times a day (QID) | RESPIRATORY_TRACT | 0 refills | Status: DC | PRN

## 2017-12-12 MED ORDER — LEVOFLOXACIN 750 MG PO TABS: 750.0000 mg | ORAL_TABLET | Freq: Every day | ORAL | 0 refills | Status: AC

## 2017-12-12 MED ORDER — ALBUTEROL SULFATE HFA 108 (90 BASE) MCG/ACT IN AERS
INHALATION_SPRAY | RESPIRATORY_TRACT | Status: AC
  Filled 2017-12-12: qty 6.7

## 2017-12-12 MED ORDER — ALBUTEROL SULFATE HFA 108 (90 BASE) MCG/ACT IN AERS
2.0000 | INHALATION_SPRAY | Freq: Once | RESPIRATORY_TRACT | Status: AC
  Administered 2017-12-12: 2 via RESPIRATORY_TRACT

## 2017-12-12 NOTE — ED Triage Notes (Signed)
C/o dry cough with some green mucus, fatique, chills and states has fever off and on onset 3 weeks

## 2017-12-12 NOTE — ED Provider Notes (Signed)
[facility]    CSN: [id number] Arrival date & time: 12/12/17  1203     History   Chief Complaint Chief Complaint  Patient presents with  . Cough    HPI Tiffany Franklin is a 72 y.o. female.   72 year old female comes in for 3-week history of URI symptoms.  She has had productive cough, fatigue, chills, fever.  She was seen by her PCP a few weeks ago, and finished Omnicef 1.5 weeks ago.  States symptoms slightly improved but then symptoms returned.  T-max 101.  Has been taking Tylenol for the symptoms.  she has shortness of breath, described as labored breathing but able to take deep breaths.  Some wheezing that she thinks comes from the throat.  Denies rhinorrhea, nasal congestion.  Never smoker, denies history of asthma. Passive smoker growing up. States she was told she had COPD like lungs on CXR, and has been evaluated by pulmonologist. States she was told she does not have COPD.       Past Medical History:  Diagnosis Date  . Allergy   . Anxiety   . Arthritis   . 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 Cataract of both eyes   . Occasional tremors    hands bilat   . Pneumonia     Patient Active Problem List   Diagnosis Date Noted  . Pneumonia 10/05/2012    Past Surgical History:  Procedure Laterality Date  . ABDOMINAL HYSTERECTOMY    . APPENDECTOMY    . DILATION AND CURETTAGE OF UTERUS    . SOFT TISSUE MASS EXCISION Left   . TUBAL LIGATION      OB History    No data available       Home Medications    Prior to Admission medications   Medication Sig Start Date End Date Taking? Authorizing Provider  albuterol (PROVENTIL) (2.5 MG/3ML) 0.083% nebulizer solution Take 3 mLs (2.5 mg total) by nebulization every 6 (six) hours as needed for wheezing or shortness of breath. 12/12/17   Tasia Catchings, Amy V, PA-C  cetirizine (ZYRTEC) 10 MG tablet Take 10 mg by mouth daily as needed for allergies.    [provider]  levofloxacin (LEVAQUIN) 750 MG tablet Take 1 tablet (750 mg total) by mouth daily for 5 days. 12/12/17 12/17/17  Tasia Catchings, Amy V, PA-C  zolpidem (AMBIEN) 5 MG tablet Take 2.5 mg by mouth at bedtime as needed for sleep.  04/15/15   [provider]    Family History Family History  Problem Relation Age of Onset  . Breast cancer Maternal Aunt   . Breast cancer Paternal Aunt     Social History Social History   Tobacco Use  . Smoking status: Never Smoker  . Smokeless tobacco: Never Used  Substance Use Topics  . Alcohol use: Yes    Comment: occ  . Drug use: No     Allergies   Demerol [meperidine]   Review of Systems Review of Systems  Reason unable to perform ROS: See HPI as above.     Physical Exam Triage Vital Signs ED Triage Vitals  Enc Vitals Group     BP 12/12/17 1237 127/61     Pulse Rate 12/12/17 1237 98     Resp --      Temp 12/12/17 1237 98.1 F (36.7 C)     Temp Source 12/12/17 1237 Oral     SpO2 12/12/17 1237 92 %  Weight --      Height --      Head Circumference --      Peak Flow --      Pain Score 12/12/17 1235 0     Pain Loc --      Pain Edu? --      Excl. in [place]? --    No data found.  Updated Vital Signs BP (!) 139/58 (BP Location: Left Arm)   Pulse 73   Temp (!) 97.5 F (36.4 C) (Oral)   SpO2 99%   Physical Exam  Constitutional: She is oriented to person, place, and time. She appears well-developed and well-nourished. No distress.  HENT:  Head: Normocephalic and atraumatic.  Right Ear: Tympanic membrane, external ear and ear canal normal. Tympanic membrane is not erythematous and not bulging.  Left Ear: Tympanic membrane, external ear and ear canal normal. Tympanic membrane is not erythematous and not bulging.  Nose: Nose normal. Right sinus exhibits no maxillary sinus tenderness and no frontal sinus tenderness. Left sinus exhibits no maxillary sinus tenderness and no frontal sinus tenderness.  Mouth/Throat: Uvula is  midline, oropharynx is clear and moist and mucous membranes are normal.  Eyes: Conjunctivae are normal. Pupils are equal, round, and reactive to light.  Neck: Normal range of motion. Neck supple.  Cardiovascular: Normal rate, regular rhythm and normal heart sounds. Exam reveals no gallop and no friction rub.  No murmur heard. Pulmonary/Chest: Effort normal and breath sounds normal. She has no decreased breath sounds. She has no wheezes. She has no rhonchi. She has no rales.  Lymphadenopathy:    She has no cervical adenopathy.  Neurological: She is alert and oriented to person, place, and time.  Skin: Skin is warm and dry.  Psychiatric: She has a normal mood and affect. Her behavior is normal. Judgment normal.     UC Treatments / Results  Labs (all labs ordered are listed, but only abnormal results are displayed) Labs Reviewed - No data to display  EKG  EKG Interpretation None       Radiology Dg Chest 2 View  Result Date: 12/12/2017 CLINICAL DATA:  Productive cough EXAM: CHEST - 2 VIEW COMPARISON:  07/19/2015 FINDINGS: There is hyperinflation of the lungs compatible with COPD. Increased opacity anteriorly on the lateral view, likely within the lingula concerning for lingular pneumonia. No effusions. Heart is borderline in size. No acute bony abnormality. IMPRESSION: COPD. Lingular opacity concerning for pneumonia. Electronically Signed   By: Rolm Baptise M.D.   On: 12/12/2017 13:09    Procedures Procedures (including critical care time)  Medications Ordered in UC Medications  ipratropium-albuterol (DUONEB) 0.5-2.5 (3) MG/3ML nebulizer solution 3 mL (3 mLs Nebulization Given 12/12/17 1333)  albuterol (PROVENTIL HFA;VENTOLIN HFA) 108 (90 Base) MCG/ACT inhaler 2 puff (2 puffs Inhalation Given 12/12/17 1402)     Initial Impression / Assessment and Plan / UC Course  I have reviewed the triage vital signs and the nursing notes.  Pertinent labs & imaging results that were available  during my care of the patient were reviewed by me and considered in my medical decision making (see chart for details).    Patient with improved symptoms on DuoNeb, will fill albuterol as needed for shortness of breath and wheezing.  Levaquin for pneumonia given patient finished Omnicef 1.5 weeks ago.  Strict return precautions given.  Patient to follow up with PCP for reevaluation. Patient expresses understanding and agrees with plan.  Final Clinical Impressions(s) / UC Diagnoses  Final diagnoses:  Lingular pneumonia    ED Discharge Orders        Ordered    levofloxacin (LEVAQUIN) 750 MG tablet  Daily     12/12/17 1358    albuterol (PROVENTIL) (2.5 MG/3ML) 0.083% nebulizer solution  Every 6 hours PRN     12/12/17 1358       Ok Edwards, PA-C 12/12/17 1930

## 2017-12-12 NOTE — Discharge Instructions (Signed)
Start levaquin as directed. Albuterol inhaler and nebulizer as needed for shortness of breath/wheezing. Keep hydrated, your urine should be clear to pale yellow in color. Please follow up with PCP in 1-2 weeks for recheck. Monitor for any worsening of symptoms, chest pain, shortness of breath, wheezing, swelling of the throat, follow up for reevaluation.

## 2017-12-22 DIAGNOSIS — J181 Lobar pneumonia, unspecified organism: Secondary | ICD-10-CM | POA: Diagnosis not present

## 2017-12-22 DIAGNOSIS — E860 Dehydration: Secondary | ICD-10-CM | POA: Diagnosis not present

## 2018-01-07 DIAGNOSIS — F419 Anxiety disorder, unspecified: Secondary | ICD-10-CM | POA: Diagnosis not present

## 2018-01-07 DIAGNOSIS — K30 Functional dyspepsia: Secondary | ICD-10-CM | POA: Diagnosis not present

## 2018-01-07 DIAGNOSIS — H109 Unspecified conjunctivitis: Secondary | ICD-10-CM | POA: Diagnosis not present

## 2018-01-07 DIAGNOSIS — Z79899 Other long term (current) drug therapy: Secondary | ICD-10-CM | POA: Diagnosis not present

## 2018-03-25 DIAGNOSIS — X32XXXA Exposure to sunlight, initial encounter: Secondary | ICD-10-CM | POA: Diagnosis not present

## 2018-03-25 DIAGNOSIS — L570 Actinic keratosis: Secondary | ICD-10-CM | POA: Diagnosis not present

## 2018-03-25 DIAGNOSIS — D225 Melanocytic nevi of trunk: Secondary | ICD-10-CM | POA: Diagnosis not present

## 2018-06-09 DIAGNOSIS — R82998 Other abnormal findings in urine: Secondary | ICD-10-CM | POA: Diagnosis not present

## 2018-06-09 DIAGNOSIS — Z0000 Encounter for general adult medical examination without abnormal findings: Secondary | ICD-10-CM | POA: Diagnosis not present

## 2018-06-15 DIAGNOSIS — Z01419 Encounter for gynecological examination (general) (routine) without abnormal findings: Secondary | ICD-10-CM | POA: Diagnosis not present

## 2018-06-15 DIAGNOSIS — Z1389 Encounter for screening for other disorder: Secondary | ICD-10-CM | POA: Diagnosis not present

## 2018-06-15 DIAGNOSIS — R8290 Unspecified abnormal findings in urine: Secondary | ICD-10-CM | POA: Diagnosis not present

## 2018-06-15 DIAGNOSIS — Z1272 Encounter for screening for malignant neoplasm of vagina: Secondary | ICD-10-CM | POA: Diagnosis not present

## 2018-06-15 DIAGNOSIS — Z130 Encounter for screening for diseases of the blood and blood-forming organs and certain disorders involving the immune mechanism: Secondary | ICD-10-CM | POA: Diagnosis not present

## 2018-06-16 DIAGNOSIS — J309 Allergic rhinitis, unspecified: Secondary | ICD-10-CM | POA: Diagnosis not present

## 2018-06-16 DIAGNOSIS — Z0000 Encounter for general adult medical examination without abnormal findings: Secondary | ICD-10-CM | POA: Diagnosis not present

## 2018-06-16 DIAGNOSIS — N951 Menopausal and female climacteric states: Secondary | ICD-10-CM | POA: Diagnosis not present

## 2018-06-16 DIAGNOSIS — M1990 Unspecified osteoarthritis, unspecified site: Secondary | ICD-10-CM | POA: Diagnosis not present

## 2018-06-16 DIAGNOSIS — Z6821 Body mass index (BMI) 21.0-21.9, adult: Secondary | ICD-10-CM | POA: Diagnosis not present

## 2018-06-16 DIAGNOSIS — Z1389 Encounter for screening for other disorder: Secondary | ICD-10-CM | POA: Diagnosis not present

## 2018-06-16 DIAGNOSIS — F419 Anxiety disorder, unspecified: Secondary | ICD-10-CM | POA: Diagnosis not present

## 2018-06-16 DIAGNOSIS — G4700 Insomnia, unspecified: Secondary | ICD-10-CM | POA: Diagnosis not present

## 2018-06-16 DIAGNOSIS — R748 Abnormal levels of other serum enzymes: Secondary | ICD-10-CM | POA: Diagnosis not present

## 2018-06-16 DIAGNOSIS — H04129 Dry eye syndrome of unspecified lacrimal gland: Secondary | ICD-10-CM | POA: Diagnosis not present

## 2018-06-17 DIAGNOSIS — Z1212 Encounter for screening for malignant neoplasm of rectum: Secondary | ICD-10-CM | POA: Diagnosis not present

## 2018-08-18 DIAGNOSIS — K648 Other hemorrhoids: Secondary | ICD-10-CM | POA: Diagnosis not present

## 2018-08-18 DIAGNOSIS — Z853 Personal history of malignant neoplasm of breast: Secondary | ICD-10-CM | POA: Diagnosis not present

## 2018-08-18 DIAGNOSIS — Z9089 Acquired absence of other organs: Secondary | ICD-10-CM | POA: Diagnosis not present

## 2018-08-18 DIAGNOSIS — Z86010 Personal history of colonic polyps: Secondary | ICD-10-CM | POA: Diagnosis not present

## 2018-08-18 DIAGNOSIS — K5730 Diverticulosis of large intestine without perforation or abscess without bleeding: Secondary | ICD-10-CM | POA: Diagnosis not present

## 2018-08-18 DIAGNOSIS — K635 Polyp of colon: Secondary | ICD-10-CM | POA: Diagnosis not present

## 2018-08-18 DIAGNOSIS — Z1211 Encounter for screening for malignant neoplasm of colon: Secondary | ICD-10-CM | POA: Diagnosis not present

## 2018-08-18 DIAGNOSIS — D122 Benign neoplasm of ascending colon: Secondary | ICD-10-CM | POA: Diagnosis not present

## 2018-08-23 DIAGNOSIS — D122 Benign neoplasm of ascending colon: Secondary | ICD-10-CM | POA: Diagnosis not present

## 2018-09-12 DIAGNOSIS — H04123 Dry eye syndrome of bilateral lacrimal glands: Secondary | ICD-10-CM | POA: Diagnosis not present

## 2018-10-18 ENCOUNTER — Other Ambulatory Visit: Payer: Self-pay | Admitting: Internal Medicine

## 2018-10-18 DIAGNOSIS — Z1231 Encounter for screening mammogram for malignant neoplasm of breast: Secondary | ICD-10-CM

## 2018-10-18 DIAGNOSIS — H04123 Dry eye syndrome of bilateral lacrimal glands: Secondary | ICD-10-CM | POA: Diagnosis not present

## 2018-11-17 ENCOUNTER — Ambulatory Visit
Admission: RE | Admit: 2018-11-17 | Discharge: 2018-11-17 | Disposition: A | Discharge status: Home / Self Care | Payer: PPO | Source: Ambulatory Visit | Attending: Internal Medicine | Admitting: Internal Medicine

## 2018-11-17 DIAGNOSIS — Z1231 Encounter for screening mammogram for malignant neoplasm of breast: Secondary | ICD-10-CM

## 2019-06-02 ENCOUNTER — Other Ambulatory Visit: Payer: Self-pay | Admitting: Physician Assistant

## 2019-06-02 DIAGNOSIS — D485 Neoplasm of uncertain behavior of skin: Secondary | ICD-10-CM | POA: Diagnosis not present

## 2019-06-02 DIAGNOSIS — L820 Inflamed seborrheic keratosis: Secondary | ICD-10-CM | POA: Diagnosis not present

## 2019-06-02 DIAGNOSIS — C44319 Basal cell carcinoma of skin of other parts of face: Secondary | ICD-10-CM | POA: Diagnosis not present

## 2019-06-02 DIAGNOSIS — C4401 Basal cell carcinoma of skin of lip: Secondary | ICD-10-CM | POA: Diagnosis not present

## 2019-06-02 DIAGNOSIS — D229 Melanocytic nevi, unspecified: Secondary | ICD-10-CM | POA: Diagnosis not present

## 2019-07-17 DIAGNOSIS — R05 Cough: Secondary | ICD-10-CM | POA: Diagnosis not present

## 2019-07-17 DIAGNOSIS — R5383 Other fatigue: Secondary | ICD-10-CM | POA: Diagnosis not present

## 2019-07-17 DIAGNOSIS — R0602 Shortness of breath: Secondary | ICD-10-CM | POA: Diagnosis not present

## 2019-07-17 DIAGNOSIS — Z79899 Other long term (current) drug therapy: Secondary | ICD-10-CM | POA: Diagnosis not present

## 2019-07-17 DIAGNOSIS — Z6821 Body mass index (BMI) 21.0-21.9, adult: Secondary | ICD-10-CM | POA: Diagnosis not present

## 2019-08-10 DIAGNOSIS — C44319 Basal cell carcinoma of skin of other parts of face: Secondary | ICD-10-CM | POA: Diagnosis not present

## 2019-08-10 DIAGNOSIS — L570 Actinic keratosis: Secondary | ICD-10-CM | POA: Diagnosis not present

## 2019-08-15 DIAGNOSIS — Z6820 Body mass index (BMI) 20.0-20.9, adult: Secondary | ICD-10-CM | POA: Diagnosis not present

## 2019-08-15 DIAGNOSIS — R05 Cough: Secondary | ICD-10-CM | POA: Diagnosis not present

## 2019-08-15 DIAGNOSIS — C44310 Basal cell carcinoma of skin of unspecified parts of face: Secondary | ICD-10-CM | POA: Diagnosis not present

## 2019-08-15 DIAGNOSIS — J4521 Mild intermittent asthma with (acute) exacerbation: Secondary | ICD-10-CM | POA: Diagnosis not present

## 2019-09-11 DIAGNOSIS — E7800 Pure hypercholesterolemia, unspecified: Secondary | ICD-10-CM | POA: Diagnosis not present

## 2019-09-13 DIAGNOSIS — Z1389 Encounter for screening for other disorder: Secondary | ICD-10-CM | POA: Diagnosis not present

## 2019-09-13 DIAGNOSIS — R82998 Other abnormal findings in urine: Secondary | ICD-10-CM | POA: Diagnosis not present

## 2019-09-13 DIAGNOSIS — R8290 Unspecified abnormal findings in urine: Secondary | ICD-10-CM | POA: Diagnosis not present

## 2019-09-13 DIAGNOSIS — Z01419 Encounter for gynecological examination (general) (routine) without abnormal findings: Secondary | ICD-10-CM | POA: Diagnosis not present

## 2019-09-13 DIAGNOSIS — E669 Obesity, unspecified: Secondary | ICD-10-CM | POA: Diagnosis not present

## 2019-09-13 DIAGNOSIS — Z130 Encounter for screening for diseases of the blood and blood-forming organs and certain disorders involving the immune mechanism: Secondary | ICD-10-CM | POA: Diagnosis not present

## 2019-09-18 DIAGNOSIS — F419 Anxiety disorder, unspecified: Secondary | ICD-10-CM | POA: Diagnosis not present

## 2019-09-18 DIAGNOSIS — M1990 Unspecified osteoarthritis, unspecified site: Secondary | ICD-10-CM | POA: Diagnosis not present

## 2019-09-18 DIAGNOSIS — N951 Menopausal and female climacteric states: Secondary | ICD-10-CM | POA: Diagnosis not present

## 2019-09-18 DIAGNOSIS — E7800 Pure hypercholesterolemia, unspecified: Secondary | ICD-10-CM | POA: Diagnosis not present

## 2019-09-18 DIAGNOSIS — G4700 Insomnia, unspecified: Secondary | ICD-10-CM | POA: Diagnosis not present

## 2019-09-18 DIAGNOSIS — J309 Allergic rhinitis, unspecified: Secondary | ICD-10-CM | POA: Diagnosis not present

## 2019-09-18 DIAGNOSIS — Z0000 Encounter for general adult medical examination without abnormal findings: Secondary | ICD-10-CM | POA: Diagnosis not present

## 2019-09-18 DIAGNOSIS — R748 Abnormal levels of other serum enzymes: Secondary | ICD-10-CM | POA: Diagnosis not present

## 2019-09-18 DIAGNOSIS — H04129 Dry eye syndrome of unspecified lacrimal gland: Secondary | ICD-10-CM | POA: Diagnosis not present

## 2019-09-20 DIAGNOSIS — Z1212 Encounter for screening for malignant neoplasm of rectum: Secondary | ICD-10-CM | POA: Diagnosis not present

## 2019-10-19 DIAGNOSIS — J329 Chronic sinusitis, unspecified: Secondary | ICD-10-CM | POA: Diagnosis not present

## 2019-10-19 DIAGNOSIS — R590 Localized enlarged lymph nodes: Secondary | ICD-10-CM | POA: Diagnosis not present

## 2019-10-19 DIAGNOSIS — J309 Allergic rhinitis, unspecified: Secondary | ICD-10-CM | POA: Diagnosis not present

## 2019-10-19 DIAGNOSIS — M542 Cervicalgia: Secondary | ICD-10-CM | POA: Diagnosis not present

## 2019-10-24 ENCOUNTER — Other Ambulatory Visit: Payer: Self-pay | Admitting: Internal Medicine

## 2019-10-24 DIAGNOSIS — Z1231 Encounter for screening mammogram for malignant neoplasm of breast: Secondary | ICD-10-CM

## 2019-11-07 ENCOUNTER — Other Ambulatory Visit: Payer: Self-pay | Admitting: Physician Assistant

## 2019-11-07 DIAGNOSIS — L910 Hypertrophic scar: Secondary | ICD-10-CM | POA: Diagnosis not present

## 2019-11-07 DIAGNOSIS — C44319 Basal cell carcinoma of skin of other parts of face: Secondary | ICD-10-CM | POA: Diagnosis not present

## 2019-11-29 DIAGNOSIS — Z85828 Personal history of other malignant neoplasm of skin: Secondary | ICD-10-CM | POA: Diagnosis not present

## 2019-11-29 DIAGNOSIS — C4401 Basal cell carcinoma of skin of lip: Secondary | ICD-10-CM | POA: Diagnosis not present

## 2019-12-01 ENCOUNTER — Ambulatory Visit: Payer: PPO

## 2020-01-15 ENCOUNTER — Other Ambulatory Visit: Payer: Self-pay

## 2020-01-15 ENCOUNTER — Ambulatory Visit
Admission: RE | Admit: 2020-01-15 | Discharge: 2020-01-15 | Disposition: A | Discharge status: Home / Self Care | Payer: PPO | Source: Ambulatory Visit | Attending: Internal Medicine | Admitting: Internal Medicine

## 2020-01-15 DIAGNOSIS — Z1231 Encounter for screening mammogram for malignant neoplasm of breast: Secondary | ICD-10-CM | POA: Diagnosis not present

## 2020-04-17 ENCOUNTER — Ambulatory Visit: Payer: PPO | Admitting: Podiatry

## 2020-04-17 ENCOUNTER — Other Ambulatory Visit: Payer: Self-pay

## 2020-04-17 ENCOUNTER — Encounter: Payer: Self-pay | Admitting: Podiatry

## 2020-04-17 DIAGNOSIS — L600 Ingrowing nail: Secondary | ICD-10-CM | POA: Diagnosis not present

## 2020-04-17 MED ORDER — NEOMYCIN-POLYMYXIN-HC 3.5-10000-1 OT SOLN: 3.0000 [drp] | Freq: Four times a day (QID) | OTIC | 0 refills | Status: DC

## 2020-04-17 NOTE — Progress Notes (Signed)
Subjective:   Patient ID: Tiffany Franklin, female   DOB: 74 y.o.   MRN: [id number]   HPI Patient states she has had chronic problems with her left big toe and is dropped a number of things on it over the last few years.  States that it causes her times of pain and she is tried to trim and soak it without relief in the last couple months it bothered her more.  Patient does not smoke likes to be active   Review of Systems  All other systems reviewed and are negative.       Objective:  Physical Exam Vitals and nursing note reviewed.  Constitutional:      Appearance: She is well-developed.  Pulmonary:     Effort: Pulmonary effort is normal.  Musculoskeletal:        General: Normal range of motion.  Skin:    General: Skin is warm.  Neurological:     Mental Status: She is alert.     Neurovascular status intact muscle strength found to be adequate range of motion within normal limits.  Patient is found to have incurvation of the left hallux medial border with pain with no active drainage or redness associated with this.  Patient has good digital perfusion well oriented x3     Assessment:  Ingrown toenail deformity left hallux medial border with pain     Plan:  H&P reviewed condition recommended correction of deformity and at this time I explained procedure risk.  I infiltrated the left hallux 60 mg like Marcaine mixture sterile prep done using sterile instrumentation I removed the medial border exposed matrix applied phenol 3 applications 30 seconds followed by alcohol lavage and sterile dressing.  Gave instructions on soaks and reappoint

## 2020-04-17 NOTE — Patient Instructions (Signed)

## 2020-05-23 DIAGNOSIS — Z20818 Contact with and (suspected) exposure to other bacterial communicable diseases: Secondary | ICD-10-CM | POA: Diagnosis not present

## 2020-05-23 DIAGNOSIS — J321 Chronic frontal sinusitis: Secondary | ICD-10-CM | POA: Diagnosis not present

## 2020-05-23 DIAGNOSIS — R918 Other nonspecific abnormal finding of lung field: Secondary | ICD-10-CM | POA: Diagnosis not present

## 2020-05-23 DIAGNOSIS — R05 Cough: Secondary | ICD-10-CM | POA: Diagnosis not present

## 2020-05-23 DIAGNOSIS — J189 Pneumonia, unspecified organism: Secondary | ICD-10-CM | POA: Diagnosis not present

## 2020-05-24 ENCOUNTER — Other Ambulatory Visit: Payer: Self-pay | Admitting: Adult Health

## 2020-05-24 DIAGNOSIS — R918 Other nonspecific abnormal finding of lung field: Secondary | ICD-10-CM

## 2020-06-28 ENCOUNTER — Ambulatory Visit
Admission: RE | Admit: 2020-06-28 | Discharge: 2020-06-28 | Disposition: A | Discharge status: Home / Self Care | Payer: PPO | Source: Ambulatory Visit | Attending: Adult Health | Admitting: Adult Health

## 2020-06-28 DIAGNOSIS — D7389 Other diseases of spleen: Secondary | ICD-10-CM | POA: Diagnosis not present

## 2020-06-28 DIAGNOSIS — J479 Bronchiectasis, uncomplicated: Secondary | ICD-10-CM | POA: Diagnosis not present

## 2020-06-28 DIAGNOSIS — Z8701 Personal history of pneumonia (recurrent): Secondary | ICD-10-CM | POA: Diagnosis not present

## 2020-06-28 DIAGNOSIS — R918 Other nonspecific abnormal finding of lung field: Secondary | ICD-10-CM

## 2020-06-28 DIAGNOSIS — J9811 Atelectasis: Secondary | ICD-10-CM | POA: Diagnosis not present

## 2020-06-28 MED ORDER — IOPAMIDOL (ISOVUE-300) INJECTION 61%
75.0000 mL | Freq: Once | INTRAVENOUS | Status: AC | PRN
  Administered 2020-06-28: 75 mL via INTRAVENOUS

## 2020-07-05 ENCOUNTER — Encounter: Payer: Self-pay | Admitting: Pulmonary Disease

## 2020-07-05 ENCOUNTER — Other Ambulatory Visit: Payer: Self-pay

## 2020-07-05 ENCOUNTER — Ambulatory Visit (INDEPENDENT_AMBULATORY_CARE_PROVIDER_SITE_OTHER): Payer: PPO | Admitting: Pulmonary Disease

## 2020-07-05 VITALS — BP 126/68 | HR 69 | Ht 66.0 in | Wt 134.8 lb

## 2020-07-05 DIAGNOSIS — R059 Cough, unspecified: Secondary | ICD-10-CM | POA: Diagnosis not present

## 2020-07-05 DIAGNOSIS — J479 Bronchiectasis, uncomplicated: Secondary | ICD-10-CM | POA: Diagnosis not present

## 2020-07-05 LAB — CBC WITH DIFFERENTIAL/PLATELET
Basophils Absolute: 0 10*3/uL (ref 0.0–0.1)
Basophils Relative: 0.7 % (ref 0.0–3.0)
Eosinophils Absolute: 0.1 10*3/uL (ref 0.0–0.7)
Eosinophils Relative: 2 % (ref 0.0–5.0)
HCT: 42.6 % (ref 36.0–46.0)
Hemoglobin: 14.3 g/dL (ref 12.0–15.0)
Lymphocytes Relative: 26.9 % (ref 12.0–46.0)
Lymphs Abs: 1.4 10*3/uL (ref 0.7–4.0)
MCHC: 33.7 g/dL (ref 30.0–36.0)
MCV: 94.7 fl (ref 78.0–100.0)
Monocytes Absolute: 0.4 10*3/uL (ref 0.1–1.0)
Monocytes Relative: 6.9 % (ref 3.0–12.0)
Neutro Abs: 3.3 10*3/uL (ref 1.4–7.7)
Neutrophils Relative %: 63.5 % (ref 43.0–77.0)
Platelets: 261 10*3/uL (ref 150.0–400.0)
RBC: 4.5 Mil/uL (ref 3.87–5.11)
RDW: 13.3 % (ref 11.5–15.5)
WBC: 5.2 10*3/uL (ref 4.0–10.5)

## 2020-07-05 NOTE — Progress Notes (Signed)
Tiffany Franklin    [id number]    1946-09-03  Primary Care Physician:Russo, [name], MD  Referring Physician: Shon Baton, [place],   [id number]  Chief complaint: Consult for recurrent pneumonia, abnormal CT with bronchiectasis  HPI: 74 year old with history of recurrent pneumonias She started pneumonias every couple of years for the past 69 years usually treated with antibiotics Evaluated by Dr. Amedeo Plenty at [facility] in 2012 with PFTs She also had IgE, alpha-1 antitrypsin, quantitative immunoglobulins and CBC at that time which were normal.  More recently she had CT chest x-ray and PFTs after another bout of pneumonia with findings of bronchiectasis and tree-in-bud and has been referred here for further evaluation. She was treated with levofloxacin in August 2021 for pneumonia, frontal sinusitis and started on breztri inhaler.  Follows with Dr. Earlean Shawl, [name] for colon polyps  Pets: No pets Occupation: Used to work in Scientist, clinical (histocompatibility and immunogenetics), accounting Exposures: No known exposures.  No mold, hot tub, Jacuzzi, no feather pillows Smoking history: Social smoker.  Quit in 1970s Travel history: Originally from [place], [place].  No significant recent travel Relevant family history: No significant family history of lung disease   Outpatient Encounter Medications as of 07/05/2020  Medication Sig  . albuterol (PROVENTIL) (2.5 MG/3ML) 0.083% nebulizer solution Take 3 mLs (2.5 mg total) by nebulization every 6 (six) hours as needed for wheezing or shortness of breath.  . cetirizine (ZYRTEC) 10 MG tablet Take 10 mg by mouth daily as needed for allergies.  Marland Kitchen zolpidem (AMBIEN) 5 MG tablet Take 2.5 mg by mouth at bedtime as needed for sleep.   . [DISCONTINUED] neomycin-polymyxin-hydrocortisone (CORTISPORIN) OTIC solution Place 3 drops into the left ear 4 (four) times daily.   No facility-administered encounter medications on file as of 07/05/2020.    Allergies as of  07/05/2020 - Review Complete 07/05/2020  Allergen Reaction Noted  . Demerol [meperidine] Nausea And Vomiting and Other (See Comments) 10/05/2012    Past Medical History:  Diagnosis Date  . Allergy   . Anxiety   . Arthritis   . 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 Cataract of both eyes   . Occasional tremors    hands bilat   . Pneumonia     Past Surgical History:  Procedure Laterality Date  . ABDOMINAL HYSTERECTOMY    . APPENDECTOMY    . DILATION AND CURETTAGE OF UTERUS    . SOFT TISSUE MASS EXCISION Left   . TUBAL LIGATION      Family History  Problem Relation Age of Onset  . Breast cancer Maternal Aunt   . Breast cancer Paternal Aunt     Social History   Socioeconomic History  . Marital status: Widowed    Spouse name: Not on file  . Number of children: Not on file  . Years of education: Not on file  . Highest education level: Not on file  Occupational History  . Not on file  Tobacco Use  . Smoking status: Never Smoker  . Smokeless tobacco: Never Used  Substance and Sexual Activity  . Alcohol use: Yes    Comment: occ  . Drug use: No  . Sexual activity: Not on file  Other Topics Concern  . Not on file  Social History Narrative  . Not on file   Social Determinants of Health   Financial Resource Strain:   . Difficulty of Paying Living  Expenses: Not on file  Food Insecurity:   . Worried About Charity fundraiser in the Last Year: Not on file  . Ran Out of Food in the Last Year: Not on file  Transportation Needs:   . Lack of Transportation (Medical): Not on file  . Lack of Transportation (Non-Medical): Not on file  Physical Activity:   . Days of Exercise per Week: Not on file  . Minutes of Exercise per Session: Not on file  Stress:   . Feeling of Stress : Not on file  Social Connections:   . Frequency of Communication with Friends and Family: Not on file  . Frequency of Social Gatherings with  Friends and Family: Not on file  . Attends Religious Services: Not on file  . Active Member of Clubs or Organizations: Not on file  . Attends Archivist Meetings: Not on file  . Marital Status: Not on file  Intimate Partner Violence:   . Fear of Current or Ex-Partner: Not on file  . Emotionally Abused: Not on file  . Physically Abused: Not on file  . Sexually Abused: Not on file    Review of systems: Review of Systems  Constitutional: Negative for fever and chills.  HENT: Negative.   Eyes: Negative for blurred vision.  Respiratory: as per HPI  Cardiovascular: Negative for chest pain and palpitations.  Gastrointestinal: Negative for vomiting, diarrhea, blood per rectum. Genitourinary: Negative for dysuria, urgency, frequency and hematuria.  Musculoskeletal: Negative for myalgias, back pain and joint pain.  Skin: Negative for itching and rash.  Neurological: Negative for dizziness, tremors, focal weakness, seizures and loss of consciousness.  Endo/Heme/Allergies: Negative for environmental allergies.  Psychiatric/Behavioral: Negative for depression, suicidal ideas and hallucinations.  All other systems reviewed and are negative.  Physical Exam: Blood pressure 126/68, pulse 69, height 5\' 6"  (1.676 m), weight 134 lb 12.8 oz (61.1 kg), SpO2 99 %. Gen:      No acute distress HEENT:  EOMI, sclera anicteric Neck:     No masses; no thyromegaly Lungs:    Clear to auscultation bilaterally; normal respiratory effort CV:         Regular rate and rhythm; no murmurs Abd:      + bowel sounds; soft, non-tender; no palpable masses, no distension Ext:    No edema; adequate peripheral perfusion Skin:      Warm and dry; no rash Neuro: alert and oriented x 3 Psych: normal mood and affect  Data Reviewed: Imaging: CT chest 06/28/2020-bronchiectasis in the lingula and right middle lobe with patchy nodular groundglass opacities with tree-in-bud.  Esophageal wall thickening. I have reviewed  the images personally.  PFTs:  Labs:  Assessment:  Bronchiectasis, recurrent pneumonia I have reviewed the CT scan with findings of bronchiectasis and tree-in-bud suggestive of chronic infection such as MAI. She has had an evaluation for this in 2012 with normal lab tests We will repeat those including quantitative immunoglobulin, IgG subclass, IgE and CTD serologies including ANA, rheumatoid factor, CCP and alpha 1  Schedule her for for pulmonary function test and bronchoscopy for cultures as she is not making any sputum  Findings of esophageal thickening raises the possibility of GERD, chronic aspiration I have asked her to follow back with Dr. Doneta Public to evaluate this.  She may need an EGD or esophagram I also schedule her for modified barium swallow as she does have some symptoms of dysphagia.  Plan/Recommendations: CBC, immunoglobulin, CTD serologies, AI AT PFTs Modified barium swallow  Recommended GI eval Schedule bronchoscope   Marshell Garfinkel MD [facility] 07/05/2020, 10:13 AM  CC: Shon Baton, MD

## 2020-07-05 NOTE — Patient Instructions (Addendum)
We will check labs today including CBC differential, IgE, quantitative immunoglobulin, IgG subclass ANA, rheumatoid factor, CCP, alpha-1 antitrypsin levels and phenotype  Schedule pulmonary function test We will schedule you for a procedure called bronchoscope for further evaluation of her lungs Refer for moderate barium swallow to evaluate aspiration I suggest that you follow-up with GI as there are findings on your CT scan suggestive of inflammation of your food pipe and acid reflux  Follow-up in 1 to 2 months.

## 2020-07-09 ENCOUNTER — Encounter: Payer: Self-pay | Admitting: Pulmonary Disease

## 2020-07-09 NOTE — Progress Notes (Signed)
Patient called let her know the time, date and location of procedure. I also got her set up for covid testing on 07/15/20 at 1:30pm.

## 2020-07-09 NOTE — Progress Notes (Signed)
Please let patient know that her bronchoscopy is scheduled on 10/14 at 8:30Am Tiffany Franklin long Can you please arrange for Covid testing thank you

## 2020-07-15 ENCOUNTER — Other Ambulatory Visit (HOSPITAL_COMMUNITY)
Admission: RE | Admit: 2020-07-15 | Discharge: 2020-07-15 | Disposition: A | Discharge status: Home / Self Care | Payer: PPO | Source: Ambulatory Visit | Attending: Pulmonary Disease | Admitting: Pulmonary Disease

## 2020-07-15 ENCOUNTER — Encounter (HOSPITAL_COMMUNITY): Payer: Self-pay | Admitting: Pulmonary Disease

## 2020-07-15 ENCOUNTER — Other Ambulatory Visit: Payer: Self-pay

## 2020-07-15 DIAGNOSIS — Z01812 Encounter for preprocedural laboratory examination: Secondary | ICD-10-CM | POA: Diagnosis not present

## 2020-07-15 DIAGNOSIS — Z20822 Contact with and (suspected) exposure to covid-19: Secondary | ICD-10-CM | POA: Insufficient documentation

## 2020-07-15 LAB — SARS CORONAVIRUS 2 (TAT 6-24 HRS): SARS Coronavirus 2: NEGATIVE

## 2020-07-18 ENCOUNTER — Encounter (HOSPITAL_COMMUNITY): Payer: Self-pay | Admitting: Pulmonary Disease

## 2020-07-18 ENCOUNTER — Ambulatory Visit (HOSPITAL_COMMUNITY): Payer: PPO

## 2020-07-18 ENCOUNTER — Encounter (HOSPITAL_COMMUNITY)
Admission: RE | Disposition: A | Discharge status: Home / Self Care | Payer: Self-pay | Source: Home / Self Care | Attending: Pulmonary Disease

## 2020-07-18 ENCOUNTER — Ambulatory Visit (HOSPITAL_COMMUNITY)
Admission: RE | Admit: 2020-07-18 | Discharge: 2020-07-18 | Disposition: A | Discharge status: Home / Self Care | Payer: PPO | Attending: Pulmonary Disease | Admitting: Pulmonary Disease

## 2020-07-18 ENCOUNTER — Ambulatory Visit (HOSPITAL_COMMUNITY): Payer: PPO | Admitting: Certified Registered Nurse Anesthetist

## 2020-07-18 DIAGNOSIS — J189 Pneumonia, unspecified organism: Secondary | ICD-10-CM | POA: Diagnosis not present

## 2020-07-18 DIAGNOSIS — Z79899 Other long term (current) drug therapy: Secondary | ICD-10-CM | POA: Insufficient documentation

## 2020-07-18 DIAGNOSIS — Z8701 Personal history of pneumonia (recurrent): Secondary | ICD-10-CM | POA: Diagnosis not present

## 2020-07-18 DIAGNOSIS — J479 Bronchiectasis, uncomplicated: Secondary | ICD-10-CM | POA: Diagnosis not present

## 2020-07-18 DIAGNOSIS — J90 Pleural effusion, not elsewhere classified: Secondary | ICD-10-CM | POA: Diagnosis not present

## 2020-07-18 DIAGNOSIS — J984 Other disorders of lung: Secondary | ICD-10-CM | POA: Diagnosis not present

## 2020-07-18 DIAGNOSIS — Z9889 Other specified postprocedural states: Secondary | ICD-10-CM

## 2020-07-18 HISTORY — DX: Presence of spectacles and contact lenses: Z97.3

## 2020-07-18 HISTORY — PX: BRONCHIAL WASHINGS: SHX5105

## 2020-07-18 HISTORY — PX: VIDEO BRONCHOSCOPY: SHX5072

## 2020-07-18 LAB — BODY FLUID CELL COUNT WITH DIFFERENTIAL
Lymphs, Fluid: 5 %
Monocyte-Macrophage-Serous Fluid: 8 % — ABNORMAL LOW (ref 50–90)
Neutrophil Count, Fluid: 87 % — ABNORMAL HIGH (ref 0–25)
Total Nucleated Cell Count, Fluid: 3740 cu mm — ABNORMAL HIGH (ref 0–1000)

## 2020-07-18 LAB — ALPHA-1 ANTITRYPSIN PHENOTYPE: A-1 Antitrypsin, Ser: 126 mg/dL (ref 83–199)

## 2020-07-18 LAB — ANA,IFA RA DIAG PNL W/RFLX TIT/PATN
Anti Nuclear Antibody (ANA): NEGATIVE
Cyclic Citrullin Peptide Ab: <16 UNITS
Rheumatoid fact SerPl-aCnc: <14 IU/mL (ref <14)

## 2020-07-18 LAB — IGE: IgE (Immunoglobulin E), Serum: 42 kU/L (ref <=114)

## 2020-07-18 LAB — IGG, IGA, IGM
IgG (Immunoglobin G), Serum: 848 mg/dL (ref 600–1540)
IgM, Serum: 83 mg/dL (ref 50–300)
Immunoglobulin A: 218 mg/dL (ref 70–320)

## 2020-07-18 SURGERY — VIDEO BRONCHOSCOPY WITHOUT FLUORO
Anesthesia: Moderate Sedation

## 2020-07-18 MED ORDER — PHENYLEPHRINE HCL 1 % NA SOLN
NASAL | Status: AC
  Filled 2020-07-18: qty 15

## 2020-07-18 MED ORDER — LIDOCAINE HCL 4 % EX SOLN
Freq: Every day | CUTANEOUS | Status: DC | PRN
  Filled 2020-07-18: qty 50

## 2020-07-18 MED ORDER — PHENYLEPHRINE HCL 1 % NA SOLN
1.0000 [drp] | Freq: Four times a day (QID) | NASAL | Status: DC | PRN
  Administered 2020-07-18: 1 [drp] via NASAL

## 2020-07-18 MED ORDER — LIDOCAINE HCL URETHRAL/MUCOSAL 2 % EX GEL: 1.0000 "application " | Freq: Once | CUTANEOUS | Status: DC

## 2020-07-18 MED ORDER — FENTANYL CITRATE (PF) 100 MCG/2ML IJ SOLN
INTRAMUSCULAR | Status: AC
Start: 2020-07-18 — End: ?
  Filled 2020-07-18: qty 4

## 2020-07-18 MED ORDER — BUTAMBEN-TETRACAINE-BENZOCAINE 2-2-14 % EX AERO
INHALATION_SPRAY | CUTANEOUS | Status: DC | PRN
  Administered 2020-07-18: 2 via TOPICAL

## 2020-07-18 MED ORDER — LACTATED RINGERS IV SOLN: INTRAVENOUS | Status: AC | PRN

## 2020-07-18 MED ORDER — MIDAZOLAM HCL (PF) 5 MG/ML IJ SOLN
INTRAMUSCULAR | Status: DC | PRN
  Administered 2020-07-18 (×3): 1 mg via INTRAVENOUS

## 2020-07-18 MED ORDER — LIDOCAINE HCL (PF) 4 % IJ SOLN
INTRAMUSCULAR | Status: AC
  Filled 2020-07-18: qty 5

## 2020-07-18 MED ORDER — LIDOCAINE HCL URETHRAL/MUCOSAL 2 % EX GEL
1.0000 "application " | Freq: Once | CUTANEOUS | Status: AC
  Administered 2020-07-18: 1 via TOPICAL

## 2020-07-18 MED ORDER — FENTANYL CITRATE (PF) 100 MCG/2ML IJ SOLN
INTRAMUSCULAR | Status: DC | PRN
Start: 2020-07-18 — End: 2020-07-18
  Administered 2020-07-18 (×3): 25 ug via INTRAVENOUS

## 2020-07-18 MED ORDER — LIDOCAINE HCL URETHRAL/MUCOSAL 2 % EX GEL
CUTANEOUS | Status: AC
  Filled 2020-07-18: qty 30

## 2020-07-18 MED ORDER — MIDAZOLAM HCL (PF) 5 MG/ML IJ SOLN
INTRAMUSCULAR | Status: AC
  Filled 2020-07-18: qty 2

## 2020-07-18 MED ORDER — BUTAMBEN-TETRACAINE-BENZOCAINE 2-2-14 % EX AERO: 2.0000 | INHALATION_SPRAY | Freq: Once | CUTANEOUS | Status: DC

## 2020-07-18 MED ORDER — OXYMETAZOLINE HCL 0.05 % NA SOLN
1.0000 | Freq: Two times a day (BID) | NASAL | Status: DC
  Filled 2020-07-18: qty 15

## 2020-07-18 MED ORDER — PHENYLEPHRINE HCL 0.25 % NA SOLN: 1.0000 | Freq: Four times a day (QID) | NASAL | Status: DC | PRN

## 2020-07-18 MED ORDER — LIDOCAINE HCL 1 % IJ SOLN
INTRAMUSCULAR | Status: AC
  Filled 2020-07-18: qty 1

## 2020-07-18 MED ORDER — LIDOCAINE HCL (PF) 1 % IJ SOLN
INTRAMUSCULAR | Status: DC | PRN
  Administered 2020-07-18 (×4): 2 mL

## 2020-07-18 NOTE — Discharge Instructions (Signed)
Flexible Bronchoscopy, Care After This sheet gives you information about how to care for yourself after your test. Your doctor may also give you more specific instructions. If you have problems or questions, contact your doctor. Follow these instructions at home: Eating and drinking  Do not eat or drink anything (not even water) for 2 hours after your test, or until your numbing medicine (local anesthetic) wears off.  When your numbness is gone and your cough and gag reflexes have come back, you may: ? Eat only soft foods. ? Slowly drink liquids.  The day after the test, go back to your normal diet. Driving  Do not drive for 24 hours if you were given a medicine to help you relax (sedative).  Do not drive or use heavy machinery while taking prescription pain medicine. General instructions   Take over-the-counter and prescription medicines only as told by your doctor.  Return to your normal activities as told. Ask what activities are safe for you.  Do not use any products that have nicotine or tobacco in them. This includes cigarettes and e-cigarettes. If you need help quitting, ask your doctor.  Keep all follow-up visits as told by your doctor. This is important. It is very important if you had a tissue sample (biopsy) taken. Get help right away if:  You have shortness of breath that gets worse.  You get light-headed.  You feel like you are going to pass out (faint).  You have chest pain.  You cough up: ? More than a little blood. ? More blood than before. Summary  Do not eat or drink anything (not even water) for 2 hours after your test, or until your numbing medicine wears off.  Do not use cigarettes. Do not use e-cigarettes.  Get help right away if you have chest pain. This information is not intended to replace advice given to you by your health care provider. Make sure you discuss any questions you have with your health care provider. Document Revised: 09/03/2017  Document Reviewed: 10/09/2016 Elsevier Patient Education  2020 [employer].

## 2020-07-18 NOTE — H&P (Signed)
         Tiffany Franklin    [id number]    [birth year]  Primary Care 56, [name], MD  Referring Physician: No referring provider defined for this encounter.  Chief complaint: Follow up for recurrent pneumonia, abnormal CT with bronchiectasis  HPI: 74 year old with history of recurrent pneumonias She started pneumonias every couple of years for the past 20 years usually treated with antibiotics Evaluated by Dr. Amedeo Plenty at [facility] in 2012 with PFTs She also had IgE, alpha-1 antitrypsin, quantitative immunoglobulins and CBC at that time which were normal.  More recently she had CT chest x-ray and PFTs after another bout of pneumonia with findings of bronchiectasis and tree-in-bud and has been referred here for further evaluation. She was treated with levofloxacin in August 2021 for pneumonia, frontal sinusitis and started on breztri inhaler.  Follows with Dr. Earlean Shawl, [name] for colon polyps  Pets: No pets Occupation: Used to work in Scientist, clinical (histocompatibility and immunogenetics), accounting Exposures: No known exposures.  No mold, hot tub, Jacuzzi, no feather pillows Smoking history: Social smoker.  Quit in 1970s Travel history: Originally from [place], [place].  No significant recent travel Relevant family history: No significant family history of lung disease  Interim history: Here today for bronchoscopy.  States that she is feeling well with no issues Has chronic cough with white mucus.  Denies any dyspnea, fevers.   Outpatient Encounter Medications as of 07/05/2020  Medication Sig  . albuterol (PROVENTIL) (2.5 MG/3ML) 0.083% nebulizer solution Take 3 mLs (2.5 mg total) by nebulization every 6 (six) hours as needed for wheezing or shortness of breath.  . cetirizine (ZYRTEC) 10 MG tablet Take 10 mg by mouth daily as needed for allergies.  Marland Kitchen zolpidem (AMBIEN) 5 MG tablet Take 2.5 mg by mouth at bedtime as needed for sleep.   . [DISCONTINUED] neomycin-polymyxin-hydrocortisone (CORTISPORIN) OTIC  solution Place 3 drops into the left ear 4 (four) times daily.   No facility-administered encounter medications on file as of 07/05/2020.    Physical Exam: Blood pressure (!) 142/67, pulse 74, temperature 98.8 F (37.1 C), temperature source Oral, resp. rate 11, height _0  (1.676 m), weight 59.9 kg, SpO2 98 %. Gen:      No acute distress HEENT:  EOMI, sclera anicteric Neck:     No masses; no thyromegaly Lungs:    Clear to auscultation bilaterally; normal respiratory effort CV:         Regular rate and rhythm; no murmurs Abd:      + bowel sounds; soft, non-tender; no palpable masses, no distension Ext:    No edema; adequate peripheral perfusion Skin:      Warm and dry; no rash Neuro: alert and oriented x 3 Psych: normal mood and affect  Data Reviewed: Imaging: CT chest 06/28/2020-bronchiectasis in the lingula and right middle lobe with patchy nodular groundglass opacities with tree-in-bud.  Esophageal wall thickening. I have reviewed the images personally.  PFTs:  Labs:  Assessment:  Bronchiectasis, recurrent pneumonia I have reviewed the CT scan with findings of bronchiectasis and tree-in-bud suggestive of chronic infection such as MAI. She has had an evaluation for this in 2012 with normal lab tests  Scheduled for bronchoscopy for cultures as she is not making any sputum Risk/benefit discussed with patient and she has agreed to proceed Consent signed  Plan/Recommendations: Bronchoscopy with BAL  Marshell Garfinkel MD [facility] 07/18/2020, 8:22 AM  CC: No ref. provider found

## 2020-07-18 NOTE — Op Note (Signed)
[facility] Cardiop[facility] Patient Name: Tiffany Franklin Procedure Date: 07/18/2020 MRN: [id number] Attending MD: Marshell Garfinkel , MD Date of Birth: 03-04-1946 CSN: [id number] Age: 74 Admit Type: Outpatient Ethnicity: Not Hispanic or Latino Procedure:             Bronchoscopy Indications:           Diagnostic bronchoalveolar lavage Providers:             Marshell Garfinkel, MD, Doristine Johns, RN, Clyde Lundborg, RN, Cletis Athens, Technician Referring MD:           Medicines:             Midazolam 3 mg mg IV, Fentanyl 75 mcg IV Complications:         No immediate complications Estimated Blood Loss:  Estimated blood loss: none. Procedure:      Pre-Anesthesia Assessment:      - A History and Physical has been performed. Patient meds and allergies       have been reviewed. The risks and benefits of the procedure and the       sedation options and risks were discussed with the patient. All       questions were answered and informed consent was obtained. Patient       identification and proposed procedure were verified prior to the       procedure by the physician in the pre-procedure area. Mental Status       Examination: alert and oriented. Airway Examination: normal       oropharyngeal airway. Respiratory Examination: clear to auscultation. CV       Examination: RRR, no murmurs, no S3 or S4. ASA Grade Assessment: II - A       patient with mild systemic disease. After reviewing the risks and       benefits, the patient was deemed in satisfactory condition to undergo       the procedure. The anesthesia plan was to use moderate sedation /       analgesia (conscious sedation). Immediately prior to administration of       medications, the patient was re-assessed for adequacy to receive       sedatives. The heart rate, respiratory rate, oxygen saturations, blood       pressure, adequacy of pulmonary ventilation, and response to care were        monitored throughout the procedure. The physical status of the patient       was re-assessed after the procedure.      After obtaining informed consent, the bronchoscope was passed under       direct vision. Throughout the procedure, the patient's blood pressure,       pulse, and oxygen saturations were monitored continuously. the [id number]       ([id number]) Olympus therapeutic bronchoscope was introduced through the       right nostril and advanced to the tracheobronchial tree of both lungs. Findings:      The nasopharynx/oropharynx appears normal. The larynx appears normal.       The vocal cords appear normal. The subglottic space is normal. The       trachea is of normal caliber. The carina is sharp. The tracheobronchial       tree was examined to at least the first subsegmental level. Bronchial  mucosa and anatomy are normal; there are no endobronchial lesions, and       no secretions.      Bronchoalveolar lavage was performed in the RML medial segment (B5) of       the lung and sent for cell count, bacterial culture, viral smears &       culture, and fungal & AFB analysis and cytology. 180 mL of fluid were       instilled. 100 mL were returned. The return was cloudy. There were no       mucoid plugs in the return fluid. Multiple specimens were obtained and       pooled into one specimen, which was sent for analysis. Impression:      - Bronchoalveolar lavage      - The airway examination was normal.      - Bronchoalveolar lavage was performed. Moderate Sedation:      Moderate (conscious) sedation was administered by the endoscopy nurse       and supervised by the endoscopist. The following parameters were       monitored: oxygen saturation, heart rate, blood pressure, respiratory       rate, EKG, adequacy of pulmonary ventilation, and response to care.       Total physician intraservice time was 30 minutes. Recommendation:      - Await BAL results. Procedure Code(s):      ---  Professional ---      [phone], Bronchoscopy, rigid or flexible, including fluoroscopic guidance,       when performed; with bronchial alveolar lavage      [id number], Moderate sedation services provided by the same physician or       other qualified health care professional performing the diagnostic or       therapeutic service that the sedation supports, requiring the presence       of an independent trained observer to assist in the monitoring of the       patient's level of consciousness and physiological status; initial 15       minutes of intraservice time, patient age 22 years or older      [phone], Moderate sedation; each additional 15 minutes intraservice time Diagnosis Code(s):      --- Professional ---      J47.9, Bronchiectasis, uncomplicated CPT copyright 2019 American Medical Association. All rights reserved. The codes documented in this report are preliminary and upon coder review may  be revised to meet current compliance requirements. Marshell Garfinkel, MD 07/18/2020 9:34:58 AM Number of Addenda: 0 Scope In: Scope Out:

## 2020-07-19 LAB — ACID FAST SMEAR (AFB, MYCOBACTERIA): Acid Fast Smear: POSITIVE — AB

## 2020-07-19 LAB — PNEUMOCYSTIS JIROVECI SMEAR BY DFA: Pneumocystis jiroveci Ag: NEGATIVE

## 2020-07-19 LAB — CYTOLOGY - NON PAP

## 2020-07-20 LAB — CULTURE, RESPIRATORY W GRAM STAIN: Culture: NORMAL

## 2020-07-22 ENCOUNTER — Encounter (HOSPITAL_COMMUNITY): Payer: Self-pay | Admitting: Pulmonary Disease

## 2020-08-01 ENCOUNTER — Other Ambulatory Visit: Payer: Self-pay | Admitting: Pulmonary Disease

## 2020-08-01 DIAGNOSIS — A310 Pulmonary mycobacterial infection: Secondary | ICD-10-CM

## 2020-08-07 LAB — ACID FAST CULTURE WITH REFLEXED SENSITIVITIES (MYCOBACTERIA): Acid Fast Culture: POSITIVE — AB

## 2020-08-07 LAB — AFB ORGANISM ID BY DNA PROBE
M avium complex: POSITIVE — AB
M tuberculosis complex: NEGATIVE

## 2020-08-07 LAB — MAC SUSCEPTIBILITY BROTH
Ciprofloxacin: >8
Clarithromycin: 1
Linezolid: 32
Rifampin: 4
Streptomycin: >32

## 2020-08-08 ENCOUNTER — Encounter: Payer: Self-pay | Admitting: Pulmonary Disease

## 2020-08-08 ENCOUNTER — Other Ambulatory Visit: Payer: Self-pay

## 2020-08-08 ENCOUNTER — Ambulatory Visit: Payer: PPO | Admitting: Pulmonary Disease

## 2020-08-08 ENCOUNTER — Encounter: Payer: PPO | Admitting: Pulmonary Disease

## 2020-08-08 VITALS — BP 118/70 | HR 82 | Temp 97.4°F | Ht 66.0 in | Wt 135.2 lb

## 2020-08-08 DIAGNOSIS — A310 Pulmonary mycobacterial infection: Secondary | ICD-10-CM

## 2020-08-08 DIAGNOSIS — J479 Bronchiectasis, uncomplicated: Secondary | ICD-10-CM

## 2020-08-08 LAB — PULMONARY FUNCTION TEST
DL/VA % pred: 107 %
DL/VA: 4.33 ml/min/mmHg/L
DLCO cor % pred: 75 %
DLCO cor: 15.43 ml/min/mmHg
DLCO unc % pred: 77 %
DLCO unc: 15.84 ml/min/mmHg
FEF 25-75 Post: 0.87 L/sec
FEF 25-75 Pre: 1.07 L/sec
FEF2575-%Change-Post: -18 %
FEF2575-%Pred-Post: 49 %
FEF2575-%Pred-Pre: 60 %
FEV1-%Change-Post: -2 %
FEV1-%Pred-Post: 73 %
FEV1-%Pred-Pre: 75 %
FEV1-Post: 1.7 L
FEV1-Pre: 1.74 L
FEV1FVC-%Change-Post: 3 %
FEV1FVC-%Pred-Pre: 92 %
FEV6-%Change-Post: -6 %
FEV6-%Pred-Post: 80 %
FEV6-%Pred-Pre: 85 %
FEV6-Post: 2.34 L
FEV6-Pre: 2.5 L
FEV6FVC-%Change-Post: -1 %
FEV6FVC-%Pred-Post: 103 %
FEV6FVC-%Pred-Pre: 104 %
FVC-%Change-Post: -5 %
FVC-%Pred-Post: 77 %
FVC-%Pred-Pre: 81 %
FVC-Post: 2.37 L
FVC-Pre: 2.5 L
Post FEV1/FVC ratio: 72 %
Post FEV6/FVC ratio: 99 %
Pre FEV1/FVC ratio: 70 %
Pre FEV6/FVC Ratio: 100 %
RV % pred: 125 %
RV: 2.99 L
TLC % pred: 102 %
TLC: 5.48 L

## 2020-08-08 MED ORDER — ALBUTEROL SULFATE HFA 108 (90 BASE) MCG/ACT IN AERS: 2.0000 | INHALATION_SPRAY | Freq: Four times a day (QID) | RESPIRATORY_TRACT | 6 refills | Status: AC | PRN

## 2020-08-08 NOTE — Progress Notes (Signed)
PFT done today. 

## 2020-08-08 NOTE — Progress Notes (Signed)
Tiffany Franklin    [id number]    1946-04-22  Primary Care Physician:Russo, [name], MD  Referring Physician: Shon Baton, [place],   [id number]  Chief complaint: Consult for recurrent pneumonia, abnormal CT with bronchiectasis  HPI: 74 year old with history of recurrent pneumonias She started pneumonias every couple of years for the past 51 years usually treated with antibiotics Evaluated by Dr. Amedeo Plenty at [facility] in 2012 with PFTs She also had IgE, alpha-1 antitrypsin, quantitative immunoglobulins and CBC at that time which were normal.  More recently she had CT chest x-ray and PFTs after another bout of pneumonia with findings of bronchiectasis and tree-in-bud and has been referred here for further evaluation. She was treated with levofloxacin in August 2021 for pneumonia, frontal sinusitis and started on breztri inhaler.  Follows with Dr. Earlean Shawl, [name] for colon polyps  Pets: No pets Occupation: Used to work in Scientist, clinical (histocompatibility and immunogenetics), accounting Exposures: No known exposures.  No mold, hot tub, Jacuzzi, no feather pillows Smoking history: Social smoker.  Quit in 1970s Travel history: Originally from [place], [place].  No significant recent travel Relevant family history: No significant family history of lung disease  Interim history: She underwent bronchoscopy on 07/18/2020 with culture showing MAI.  Has been referred to ID and awaiting clinic visit  Continues to have chronic cough, occasional chest congestion.  Outpatient Encounter Medications as of 08/08/2020  Medication Sig  . acetaminophen (TYLENOL) 500 MG tablet Take 500 mg by mouth every 6 (six) hours as needed (patient took 1/2 pill).  Marland Kitchen albuterol (PROVENTIL) (2.5 MG/3ML) 0.083% nebulizer solution Take 3 mLs (2.5 mg total) by nebulization every 6 (six) hours as needed for wheezing or shortness of breath.  . ALPRAZolam (XANAX) 0.25 MG tablet Take 0.25 mg by mouth daily as needed for anxiety.    . Artificial Tear Solution (TEARS NATURALE OP) Place 1-2 drops into both eyes 3 (three) times daily as needed (dry/irritated eyes.).  Marland Kitchen Budeson-Glycopyrrol-Formoterol (BREZTRI AEROSPHERE) 160-9-4.8 MCG/ACT AERO Inhale 1 puff into the lungs daily as needed (respiratory issues.).  Marland Kitchen cetirizine (ZYRTEC) 10 MG tablet Take 10 mg by mouth daily as needed for allergies.  . diazepam (VALIUM) 2 MG tablet Take 2 mg by mouth daily as needed (tension/pain.).   Marland Kitchen Multiple Vitamin (MULTIVITAMIN WITH MINERALS) TABS tablet Take 1 tablet by mouth daily. Women's 50+  . zolpidem (AMBIEN) 5 MG tablet Take 2.5 mg by mouth at bedtime as needed for sleep.    Facility-Administered Encounter Medications as of 08/08/2020  Medication  . lactated ringers infusion   Physical Exam: Blood pressure 118/70, pulse 82, temperature (!) 97.4 F (36.3 C), height _0  (1.676 m), weight 135 lb 3.2 oz (61.3 kg), SpO2 97 %. Gen:      No acute distress HEENT:  EOMI, sclera anicteric Neck:     No masses; no thyromegaly Lungs:    Clear to auscultation bilaterally; normal respiratory effort CV:         Regular rate and rhythm; no murmurs Abd:      + bowel sounds; soft, non-tender; no palpable masses, no distension Ext:    No edema; adequate peripheral perfusion Skin:      Warm and dry; no rash Neuro: alert and oriented x 3 Psych: normal mood and affect  Data Reviewed: Imaging: CT chest 06/28/2020-bronchiectasis in the lingula and right middle lobe with patchy nodular groundglass opacities with tree-in-bud.  Esophageal wall thickening. I have reviewed the  images personally.  PFTs: 06/08/2020 FVC 2.37 [77%], FEV1 1.70 [73%], F/F 72, TLC 5.48 [102%], DLCO 15. 84 [77%] Minimal obstruction, minimal diffusion defect  Labs: Quantitative immunoglobulins 07/05/2020-normal Alpha-1 antitrypsin 07/05/2020-126, PIMS ANA, CCP, rheumatoid factor 07/05/2020-negative  BAL 07/18/2020 3740 cells, 87% neutrophils Cultures positive for  MAI  Assessment:  Bronchiectasis, recurrent pneumonia MAI Diagnosis of MAI made on bronchoscope.  Results discussed in detail with patient today Schedule to see ID for consideration of treatment  We will start flutter valve and Mucinex for mucociliary clearance Continue albuterol as needed She has minimal obstruction on PFTs and will not need controller medication.  We will stop breztri as we like to avoid inhaled steroids given MAI infection, besides she has not used this inhaler for a long time.  GERD Findings of esophageal thickening raises the possibility of GERD, chronic aspiration She has not been unable to get in with Dr. Earlean Shawl and hence scheduled with Dr. Henrene Pastor, [facility].Marland Kitchen  She may need an EGD or esophagram, modified barium swallow  Plan/Recommendations: ID consult, GI follow-up for GERD Flutter valve, Mucinex DC breztri  Marshell Garfinkel MD [facility] 08/08/2020, 11:12 AM  CC: Shon Baton, MD

## 2020-08-08 NOTE — Patient Instructions (Addendum)
We have reviewed the results of your bronchoscopy You have a chronic infection called MAI which is causing inflammation in the lungs. We have referred you to see our infectious disease specialist next week to evaluate for treatment  Your lung function test today shows mild obstruction of the airways which is secondary to the above issue Continue the albuterol inhaler as needed.  You can stop the breztri  We will give a flutter valve.  Use Mucinex for clearance of secretion  Follow-up in 3 months

## 2020-08-12 ENCOUNTER — Ambulatory Visit (INDEPENDENT_AMBULATORY_CARE_PROVIDER_SITE_OTHER): Payer: PPO | Admitting: Internal Medicine

## 2020-08-12 ENCOUNTER — Encounter: Payer: Self-pay | Admitting: Internal Medicine

## 2020-08-12 ENCOUNTER — Other Ambulatory Visit: Payer: Self-pay

## 2020-08-12 VITALS — BP 143/84 | HR 69 | Temp 97.4°F | Wt 136.0 lb

## 2020-08-12 DIAGNOSIS — A310 Pulmonary mycobacterial infection: Secondary | ICD-10-CM | POA: Diagnosis not present

## 2020-08-12 LAB — CBC WITH DIFFERENTIAL/PLATELET
Absolute Monocytes: 469 cells/uL (ref 200–950)
Basophils Absolute: 42 cells/uL (ref 0–200)
Basophils Relative: 0.6 %
Eosinophils Absolute: 63 cells/uL (ref 15–500)
Eosinophils Relative: 0.9 %
HCT: 42.2 % (ref 35.0–45.0)
Hemoglobin: 14.3 g/dL (ref 11.7–15.5)
Lymphs Abs: 1701 cells/uL (ref 850–3900)
MCH: 31.7 pg (ref 27.0–33.0)
MCHC: 33.9 g/dL (ref 32.0–36.0)
MCV: 93.6 fL (ref 80.0–100.0)
MPV: 9 fL (ref 7.5–12.5)
Monocytes Relative: 6.7 %
Neutro Abs: 4725 cells/uL (ref 1500–7800)
Neutrophils Relative %: 67.5 %
Platelets: 304 10*3/uL (ref 140–400)
RBC: 4.51 10*6/uL (ref 3.80–5.10)
RDW: 12.2 % (ref 11.0–15.0)
Total Lymphocyte: 24.3 %
WBC: 7 10*3/uL (ref 3.8–10.8)

## 2020-08-12 LAB — COMPLETE METABOLIC PANEL WITH GFR
AG Ratio: 1.7 (calc) (ref 1.0–2.5)
ALT: 14 U/L (ref 6–29)
AST: 20 U/L (ref 10–35)
Albumin: 4.5 g/dL (ref 3.6–5.1)
Alkaline phosphatase (APISO): 96 U/L (ref 37–153)
BUN/Creatinine Ratio: 21 (calc) (ref 6–22)
BUN: 12 mg/dL (ref 7–25)
CO2: 26 mmol/L (ref 20–32)
Calcium: 10.1 mg/dL (ref 8.6–10.4)
Chloride: 105 mmol/L (ref 98–110)
Creat: 0.57 mg/dL — ABNORMAL LOW (ref 0.60–0.93)
GFR, Est African American: 105 mL/min/{1.73_m2} (ref >=60)
GFR, Est Non African American: 91 mL/min/{1.73_m2} (ref >=60)
Globulin: 2.6 g/dL (calc) (ref 1.9–3.7)
Glucose, Bld: 90 mg/dL (ref 65–99)
Potassium: 4.4 mmol/L (ref 3.5–5.3)
Sodium: 140 mmol/L (ref 135–146)
Total Bilirubin: 0.5 mg/dL (ref 0.2–1.2)
Total Protein: 7.1 g/dL (ref 6.1–8.1)

## 2020-08-12 NOTE — Patient Instructions (Addendum)
Thank you for coming to see me today. It was a pleasure seeing you.  You were diagnosed with mycobacterial pulmonary infection with MAI from a bronchoscopy done by Dr Vaughan Browner.    We are planning to start antibiotics to treat this infection as opposed to watchful waiting after I have a chance to review with Cassie, our clinic pharmacist.  Today, we will check some lab work and an EKG prior to starting treatment.  Additionally, please submit the mycobacterial sputum culture when you return to [facility].  This will help with knowing our treatment duration as you will require several months of antibiotics.  Please follow-up with me in about 3 months but call sooner with any questions/concerns.  If you have any questions or concerns, please do not hesitate to call the office at [phone].  Take Care,   Jule Ser, DO

## 2020-08-12 NOTE — Progress Notes (Signed)
[place] [facility] Infect[facility]  Reason for Consult: pulmonary mycobacterial infection Referring Provider: Dr Vaughan Browner (pulmonary)  HPI:  Tiffany Franklin is a 74 y.o. female who presents to clinic for further evaluation of pulmonary mycobacterial infection.     Patient was initially seen on July 05, 2020 by Dr. Vaughan Browner with pulmonology as a consultation for recurrent pneumonia and an abnormal CT chest with bronchiectasis.  She has a history of pneumonias that started over 20 years ago and is typically treated with antibiotics with improvement in her symptoms.  Her last bout of pneumonia was in August 2021 treated with a course of levofloxacin.  Per Dr. Cay Schillings note from October 1 she was previously evaluated by Dr. Amedeo Plenty at [facility] in 2012 with PFTs.  Also had IgE, alpha 1 antitrypsin, quantitative immunoglobulins, and CBC which were normal.  She recently had a CT chest on September 24 with findings significant for for bronchiectasis and tree-in-bud opacities thus prompting pulmonary referral.  She underwent bronchoscopy with Dr. Vaughan Browner last month and BAL cultures were positive for Mycobacterium avium.  She is originally from [place] where she has spent a good part of her life and currently lives in [place].  She has no pets, no unusual exposures, former smoker, no family history of lung disease.  She denies any fevers or chills.  She does cough daily and produce sputum.  She reports shortness of breath.  She has been treated numerous times in the past with courses of antibiotics for her pneumonia infections, however, she has never received treatment specifically for NTM infection.  Patient's Medications  New Prescriptions   No medications on file  Previous Medications   ACETAMINOPHEN (TYLENOL) 500 MG TABLET    Take 500 mg by mouth every 6 (six) hours as needed (patient took 1/2 pill).   ALBUTEROL (PROVENTIL) (2.5 MG/3ML) 0.083% NEBULIZER SOLUTION    Take 3 mLs (2.5  mg total) by nebulization every 6 (six) hours as needed for wheezing or shortness of breath.   ALBUTEROL (VENTOLIN HFA) 108 (90 BASE) MCG/ACT INHALER    Inhale 2 puffs into the lungs every 6 (six) hours as needed for wheezing or shortness of breath.   ALPRAZOLAM (XANAX) 0.25 MG TABLET    Take 0.25 mg by mouth daily as needed for anxiety.   ARTIFICIAL TEAR SOLUTION (TEARS NATURALE OP)    Place 1-2 drops into both eyes 3 (three) times daily as needed (dry/irritated eyes.).   CETIRIZINE (ZYRTEC) 10 MG TABLET    Take 10 mg by mouth daily as needed for allergies.   MULTIPLE VITAMIN (MULTIVITAMIN WITH MINERALS) TABS TABLET    Take 1 tablet by mouth daily. Women's 50+   ZOLPIDEM (AMBIEN) 5 MG TABLET    Take 2.5 mg by mouth at bedtime as needed for sleep.   Modified Medications   No medications on file  Discontinued Medications   DIAZEPAM (VALIUM) 2 MG TABLET    Take 2 mg by mouth daily as needed (tension/pain.).       Past Medical History:  Diagnosis Date  . Allergy   . Anxiety   . Arthritis   . 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 Cataract of both eyes   . Occasional tremors    hands bilat   . Pneumonia   . Wears glasses     Social History   Tobacco Use  . Smoking status: Never Smoker  .  Smokeless tobacco: Never Used  Substance Use Topics  . Alcohol use: Yes    Comment: occ  . Drug use: No    Family History  Problem Relation Age of Onset  . Breast cancer Maternal Aunt   . Breast cancer Paternal Aunt     Allergies  Allergen Reactions  . Demerol [Meperidine] Nausea And Vomiting and Other (See Comments)    Blood pressure becomes elevated.    Review of Systems: Review of Systems  Constitutional: Negative for chills, fever and weight loss.  HENT: Negative for hearing loss.   Eyes: Negative for blurred vision.  Respiratory: Positive for cough, sputum production and shortness of breath. Negative for hemoptysis.     Cardiovascular: Negative for chest pain and leg swelling.  Gastrointestinal: Negative for abdominal pain, nausea and vomiting.  Skin: Negative for rash.   All other systems reviewed and negative.  Objective: Vitals:   08/12/20 1036  BP: (!) 143/84  Pulse: 69  Temp: (!) 97.4 F (36.3 C)  TempSrc: Oral  Weight: 136 lb (61.7 kg)     Body mass index is 21.95 kg/m.  Physical Exam Vitals reviewed.  Constitutional:      General: She is not in acute distress.    Appearance: Normal appearance.     Comments: Coughing throughout visit.   Cardiovascular:     Rate and Rhythm: Normal rate and regular rhythm.     Pulses: Normal pulses.  Pulmonary:     Effort: Pulmonary effort is normal.     Breath sounds: Normal breath sounds. No wheezing.  Neurological:     General: No focal deficit present.     Mental Status: She is alert and oriented to person, place, and time.  Psychiatric:        Mood and Affect: Mood normal.        Behavior: Behavior normal.      Pertinent Labs and Microbiology  CBC Latest Ref Rng & Units 07/05/2020 07/24/2015 07/04/2015  WBC 4.0 - 10.5 K/uL 5.2 8.7 6.9  Hemoglobin 12.0 - 15.0 g/dL 14.3 13.7 15.1(H)  Hematocrit 36 - 46 % 42.6 40.2 44.6  Platelets 150 - 400 K/uL 261.0 218 245   CMP Latest Ref Rng & Units 07/24/2015 07/19/2015 10/07/2012  Glucose 65 - 99 mg/dL - 92 101(H)  BUN 6 - 20 mg/dL - 16 9  Creatinine 0.44 - 1.00 mg/dL 0.55 0.51 0.44(L)  Sodium 135 - 145 mmol/L - 142 140  Potassium 3.5 - 5.1 mmol/L - 5.1 3.1(L)  Chloride 101 - 111 mmol/L - 108 100  CO2 22 - 32 mmol/L - 27 24  Calcium 8.9 - 10.3 mg/dL - 9.9 9.5  Total Protein 6.0 - 8.3 g/dL - - 7.4  Total Bilirubin 0.3 - 1.2 mg/dL - - 0.3  Alkaline Phos 39 - 117 U/L - - 140(H)  AST 0 - 37 U/L - - 17  ALT 0 - 35 U/L - - 18     Imaging EKG 08/12/20 QTc = 418  CT Chest 06/28/20 IMPRESSION: 1. Bronchiectasis within the lingula and right middle lobe with associated patchy and ground-glass  nodular opacities. Additionally,there are scattered tree-in-bud nodular opacities within the right upper and right lower lobes as well as peripheral tree-in-bud ground-glass nodularity within the left upper and left lower lobes. Findings are nonspecific however may be secondary to an atypical infectious process such as MAI. Consider follow-up chest CT in 3-6 months to assess for interval change/resolution. 2. Mild wall thickening of the  esophagus raising the possibility of esophagitis.  Assessment and Plan: Pulmonary mycobacterial infection [facility]) Patient meets clinical, radiologic, and microbiologic criteria for diagnosis of nontuberculous mycobacterial pulmonary disease.  Based on her CT scan from June 30, 2020 it appears she has noncavitary nodular bronchiectatic disease.  We discussed for patients with noncavitary nodular bronchiectatic disease, the decision to observe versus treat depends upon the clinical presentation and the overall clinical status of the patient.  Discussed the necessity for long-term antimicrobial therapy, multiple antibiotics, and possibility for adverse side effects with administration of therapy.  Patient is still interested in starting therapy to treat this infection and to alleviate her symptoms.  Plan: -- will discuss with our clinical pharmacist regarding starting 3-drug therapy with Azithromycin 579m TIW, Ethambutol 257mkg TIW, and Rifampin 60038mIW.  Will plan to start medications one drug at a time -- we obtained a baseline EKG today with QTc of 418 and baseline vision testing (20/20 with glasses, 20/40 without glasses) -- also obtained mycobacterial sputum cultures today to guide duration of treatment -- RTC ~ 3 months   Orders Placed This Encounter  Procedures  . MYCOBACTERIA, CULTURE, WITH FLUOROCHROME SMEAR  . CBC w/Diff  . COMPLETE METABOLIC PANEL WITH GFR    I spent greater than 60 minutes with the patient including greater than 50% of time in face  to face counsel of the patient and in coordination of their care.    AndRaynelle Highlandr Infectious [facility] [place] 08/12/2020, 1:13 PM

## 2020-08-12 NOTE — Assessment & Plan Note (Signed)
Patient meets clinical, radiologic, and microbiologic criteria for diagnosis of nontuberculous mycobacterial pulmonary disease.  Based on her CT scan from June 30, 2020 it appears she has noncavitary nodular bronchiectatic disease.  We discussed for patients with noncavitary nodular bronchiectatic disease, the decision to observe versus treat depends upon the clinical presentation and the overall clinical status of the patient.  Discussed the necessity for long-term antimicrobial therapy, multiple antibiotics, and possibility for adverse side effects with administration of therapy.  Patient is still interested in starting therapy to treat this infection and to alleviate her symptoms.  Plan: -- will discuss with our clinical pharmacist regarding starting 3-drug therapy with Azithromycin 500mg  TIW, Ethambutol 25mg /kg TIW, and Rifampin 600mg  TIW.  Will plan to start medications one drug at a time -- we obtained a baseline EKG today with QTc of 418 and baseline vision testing (20/20 with glasses, 20/40 without glasses) -- also obtained mycobacterial sputum cultures today to guide duration of treatment -- RTC ~ 3 months

## 2020-08-13 ENCOUNTER — Telehealth: Payer: Self-pay | Admitting: Internal Medicine

## 2020-08-13 NOTE — Telephone Encounter (Signed)
Called patient this morning to notify of normal lab results from 08/12/20 visit.  After discussing with our clinical pharmacist, we will initiate 3-drug therapy with three times weekly dosing azithromycin 500 mg three times weekly + ethambutol 1600 mg (~25 mg/kg) three times weekly + rifampin 600 mg three times weekly.  Patient currently traveling to [place] for the Thanksgiving holiday and will be back in [place] the weekend of 11/27 and she wishes to defer starting treatment until that time.  Will send in prescriptions to her pharmacy closer to that date.    Raynelle Highland for Infectious [facility] 08/13/2020, 10:15 AM

## 2020-08-15 ENCOUNTER — Telehealth: Payer: Self-pay

## 2020-08-15 NOTE — Telephone Encounter (Signed)
Received call from [facility].   MYCOBACTERIA, CULTURE, WITH FLUOROCHROME SMEAR  Source: SPUTUM P  STATUS: PRELIMINARY P  SMEAR: Rare (1 +) acid-fast bacilli seen using the fluorochrome method.Abnormal P  RESULT:  Culture results to follow. Final reports of negative cultures can be expected in approximately six weeks. Positive cultures are reported immediately.  Dr. Juleen China  Has already discussed results with patient and started on antibiotic therapy.  Eugenia Mcalpine

## 2020-08-16 LAB — FUNGUS CULTURE RESULT

## 2020-08-16 LAB — FUNGUS CULTURE WITH STAIN

## 2020-08-16 LAB — FUNGAL ORGANISM REFLEX

## 2020-08-28 ENCOUNTER — Other Ambulatory Visit: Payer: Self-pay | Admitting: Internal Medicine

## 2020-08-28 ENCOUNTER — Telehealth: Payer: Self-pay

## 2020-08-28 DIAGNOSIS — A310 Pulmonary mycobacterial infection: Secondary | ICD-10-CM

## 2020-08-28 MED ORDER — ETHAMBUTOL HCL 400 MG PO TABS: 1600.0000 mg | ORAL_TABLET | ORAL | 1 refills | Status: DC

## 2020-08-28 MED ORDER — AZITHROMYCIN 500 MG PO TABS: 500.0000 mg | ORAL_TABLET | ORAL | 1 refills | Status: DC

## 2020-08-28 MED ORDER — RIFAMPIN 300 MG PO CAPS: 600.0000 mg | ORAL_CAPSULE | ORAL | 1 refills | Status: DC

## 2020-08-28 NOTE — Telephone Encounter (Signed)
Received culture report from [facility]. Results placed in provider box.  Beryle Flock, RN

## 2020-09-04 ENCOUNTER — Telehealth: Payer: Self-pay | Admitting: *Deleted

## 2020-09-04 NOTE — Telephone Encounter (Addendum)
Patient called with questions about how to take her MAC therapy. She took azithromycin alone Monday 11/30.  Wednesday she took azithromycin in am, ethambutol at 1pm.  She planned to add rifampin Friday.  She called to see if she needs to continue to separate these medications or if she should take them all together.  She also asks for clarification on the interaction with alcohol. RN relayed to Cassie. Per Cassie, patient should take rifampin on empty stomach (OK to eat 2 hours later), but can take all meds together, should take all 3 meds daily. She could also take the ethambutol/azithromycin at night if nauseated/has diarrhea.  Patient verbalized understanding, will take her rifampin today at 4pm to make sure she took all 3 medications. Will start with rifampin in am Friday 12/2.  Landis Gandy, RN

## 2020-09-04 NOTE — Telephone Encounter (Signed)
Thank you for helping her with this.

## 2020-09-06 ENCOUNTER — Ambulatory Visit: Payer: PPO | Admitting: Internal Medicine

## 2020-09-06 ENCOUNTER — Encounter: Payer: Self-pay | Admitting: Internal Medicine

## 2020-09-06 VITALS — BP 124/70 | HR 77 | Ht 66.0 in | Wt 134.2 lb

## 2020-09-06 DIAGNOSIS — K591 Functional diarrhea: Secondary | ICD-10-CM

## 2020-09-06 DIAGNOSIS — R1013 Epigastric pain: Secondary | ICD-10-CM

## 2020-09-06 DIAGNOSIS — R9389 Abnormal findings on diagnostic imaging of other specified body structures: Secondary | ICD-10-CM

## 2020-09-06 NOTE — Patient Instructions (Signed)
If you are age 75 or older, your body mass index should be between 23-30. Your Body mass index is 21.67 kg/m. If this is out of the aforementioned range listed, please consider follow up with your Primary Care Provider.  If you are age 66 or younger, your body mass index should be between 19-25. Your Body mass index is 21.67 kg/m. If this is out of the aformentioned range listed, please consider follow up with your Primary Care Provider.    You have been scheduled for an endoscopy. Please follow written instructions given to you at your visit today. If you use inhalers (even only as needed), please bring them with you on the day of your procedure.

## 2020-09-06 NOTE — Progress Notes (Signed)
HISTORY OF PRESENT ILLNESS:  Tiffany Franklin is a 74 y.o. female, acquaintance of Elnita Maxwell, with a history of adenomatous colon polyps (per patient Dr. Richmond Campbell) who presents today regarding abnormal CT scan of the esophagus, chronic dyspeptic symptoms, and intermittent diarrhea.  Patient was having problems with upper respiratory illness which she underwent CT scan of the chest 07/28/2020.  She was found to have bronchiectasis and radiographic changes consistent with atypical pneumonia.  She was also noted to have mild wall thickening of the esophagus raising the question of esophagitis.  She was diagnosed with MAI and is currently on triple therapy including azithromycin, ethambutol, and rifampin.  The patient tells me that she (family) have chronic history is a "sensitive stomach".  She describes nonspecific dyspeptic symptoms including bloating and gas.  Occasional cramping.  She does mention some issues with intermittent diarrhea.  She did undergo complete colonoscopy August 18, 2018 and was found to have two polyps which were removed and found to be both adenomatous and hyperplastic.  Follow-up in 5 years recommended.  She had adenomatous polyps on colonoscopies prior to that most recent examination.  Patient tells me that, at the recommendation of her sister, she has been drinking protein fortified shakes as part of the Atkins diet.  She feels that this has dramatically helped her dyspeptic symptoms.  She denies reflux symptoms.  She denies dysphagia.  She has completed her Covid vaccination series.  CBC from August 12, 2020 reveals hemoglobin of 14.3.  REVIEW OF SYSTEMS:  All non-GI ROS negative unless otherwise stated in HPI except for anxiety, arthritis  Past Medical History:  Diagnosis Date  . Allergy   . Anxiety   . Arthritis   . 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 Cataract of both eyes   . Occasional tremors    hands  bilat   . Pneumonia   . Wears glasses     Past Surgical History:  Procedure Laterality Date  . ABDOMINAL HYSTERECTOMY    . APPENDECTOMY    . BRONCHIAL WASHINGS  07/18/2020   Procedure: BRONCHIAL WASHINGS;  Surgeon: Marshell Garfinkel, MD;  Location: [facility]OSCOPY;  Service: Cardiopulmonary;;  . DILATION AND CURETTAGE OF UTERUS    . SOFT TISSUE MASS EXCISION Left   . TUBAL LIGATION    . VIDEO BRONCHOSCOPY  07/18/2020   Procedure: VIDEO BRONCHOSCOPY WITHOUT FLUORO;  Surgeon: Marshell Garfinkel, MD;  Location: [facility]OSCOPY;  Service: Cardiopulmonary;;    Social History Joycie Peek  reports that she has never smoked. She has never used smokeless tobacco. She reports current alcohol use. She reports that she does not use drugs.  family history includes Breast cancer in her maternal aunt and paternal aunt.  Allergies  Allergen Reactions  . Demerol [Meperidine] Nausea And Vomiting and Other (See Comments)    Blood pressure becomes elevated.       PHYSICAL EXAMINATION: Vital signs: BP 124/70   Pulse 77   Ht 5\' 6"  (1.676 m)   Wt 134 lb 4 oz (60.9 kg)   BMI 21.67 kg/m   Constitutional: generally well-appearing, no acute distress Psychiatric: alert and oriented x3, cooperative Eyes: extraocular movements intact, anicteric, conjunctiva pink Mouth: Mass Neck: supple no lymphadenopathy Cardiovascular: heart regular rate and rhythm, no murmur Lungs: clear to auscultation bilaterally Abdomen: soft, nontender, nondistended, no obvious ascites, no peritoneal signs, normal bowel sounds, no organomegaly Rectal: Omitted Extremities: no clubbing cyanosis or lower extremity  edema bilaterally Skin: no lesions on visible extremities Neuro: No focal deficits.  Cranial nerves intact  ASSESSMENT:  #1.  Mild thickening of the esophagus on CT.  No referable symptoms.  Unlikely significant pathology, though needs clarification. 2.  Chronic dyspepsia.  Ongoing.  Improved with fortified protein  shakes (use for now) 3.  Diarrhea.  May be antibiotic related 4.  History of adenomatous colon polyps.  Surveillance up-to-date   PLAN:  #1.  Schedule upper endoscopy to clarify CT abnormality.The nature of the procedure, as well as the risks, benefits, and alternatives were carefully and thoroughly reviewed with the patient. Ample time for discussion and questions allowed. The patient understood, was satisfied, and agreed to proceed. 2.  Observation regarding dyspepsia and diarrhea until follow-up.  Additional work-up thereafter as indicated if symptoms recur/persist. 3.  Surveillance colonoscopy due around November 2024

## 2020-09-10 DIAGNOSIS — H2513 Age-related nuclear cataract, bilateral: Secondary | ICD-10-CM | POA: Diagnosis not present

## 2020-09-10 DIAGNOSIS — H524 Presbyopia: Secondary | ICD-10-CM | POA: Diagnosis not present

## 2020-09-12 DIAGNOSIS — E7800 Pure hypercholesterolemia, unspecified: Secondary | ICD-10-CM | POA: Diagnosis not present

## 2020-09-19 DIAGNOSIS — J479 Bronchiectasis, uncomplicated: Secondary | ICD-10-CM | POA: Diagnosis not present

## 2020-09-19 DIAGNOSIS — E7800 Pure hypercholesterolemia, unspecified: Secondary | ICD-10-CM | POA: Diagnosis not present

## 2020-09-19 DIAGNOSIS — H04129 Dry eye syndrome of unspecified lacrimal gland: Secondary | ICD-10-CM | POA: Diagnosis not present

## 2020-09-19 DIAGNOSIS — L570 Actinic keratosis: Secondary | ICD-10-CM | POA: Diagnosis not present

## 2020-09-19 DIAGNOSIS — Z85828 Personal history of other malignant neoplasm of skin: Secondary | ICD-10-CM | POA: Diagnosis not present

## 2020-09-19 DIAGNOSIS — J309 Allergic rhinitis, unspecified: Secondary | ICD-10-CM | POA: Diagnosis not present

## 2020-09-19 DIAGNOSIS — Z0000 Encounter for general adult medical examination without abnormal findings: Secondary | ICD-10-CM | POA: Diagnosis not present

## 2020-09-19 DIAGNOSIS — A319 Mycobacterial infection, unspecified: Secondary | ICD-10-CM | POA: Diagnosis not present

## 2020-09-19 DIAGNOSIS — M1990 Unspecified osteoarthritis, unspecified site: Secondary | ICD-10-CM | POA: Diagnosis not present

## 2020-09-19 DIAGNOSIS — L600 Ingrowing nail: Secondary | ICD-10-CM | POA: Diagnosis not present

## 2020-09-19 DIAGNOSIS — R748 Abnormal levels of other serum enzymes: Secondary | ICD-10-CM | POA: Diagnosis not present

## 2020-09-19 DIAGNOSIS — Z1212 Encounter for screening for malignant neoplasm of rectum: Secondary | ICD-10-CM | POA: Diagnosis not present

## 2020-09-19 DIAGNOSIS — R82998 Other abnormal findings in urine: Secondary | ICD-10-CM | POA: Diagnosis not present

## 2020-09-19 DIAGNOSIS — L820 Inflamed seborrheic keratosis: Secondary | ICD-10-CM | POA: Diagnosis not present

## 2020-09-19 DIAGNOSIS — N951 Menopausal and female climacteric states: Secondary | ICD-10-CM | POA: Diagnosis not present

## 2020-09-19 DIAGNOSIS — G4700 Insomnia, unspecified: Secondary | ICD-10-CM | POA: Diagnosis not present

## 2020-09-19 DIAGNOSIS — F419 Anxiety disorder, unspecified: Secondary | ICD-10-CM | POA: Diagnosis not present

## 2020-09-20 LAB — MYCOBACTERIA,CULT W/FLUOROCHROME SMEAR
MICRO NUMBER:: 11176688
SPECIMEN QUALITY:: ADEQUATE

## 2020-10-24 ENCOUNTER — Other Ambulatory Visit: Payer: Self-pay

## 2020-10-24 ENCOUNTER — Ambulatory Visit (AMBULATORY_SURGERY_CENTER): Payer: PPO | Admitting: Internal Medicine

## 2020-10-24 ENCOUNTER — Encounter: Payer: Self-pay | Admitting: Internal Medicine

## 2020-10-24 VITALS — BP 120/53 | HR 56 | Temp 97.7°F | Resp 10 | Ht 66.0 in | Wt 134.0 lb

## 2020-10-24 DIAGNOSIS — K449 Diaphragmatic hernia without obstruction or gangrene: Secondary | ICD-10-CM

## 2020-10-24 DIAGNOSIS — R9389 Abnormal findings on diagnostic imaging of other specified body structures: Secondary | ICD-10-CM | POA: Diagnosis not present

## 2020-10-24 DIAGNOSIS — R1013 Epigastric pain: Secondary | ICD-10-CM | POA: Diagnosis not present

## 2020-10-24 DIAGNOSIS — K2940 Chronic atrophic gastritis without bleeding: Secondary | ICD-10-CM | POA: Diagnosis not present

## 2020-10-24 DIAGNOSIS — K3189 Other diseases of stomach and duodenum: Secondary | ICD-10-CM | POA: Diagnosis not present

## 2020-10-24 MED ORDER — SODIUM CHLORIDE 0.9 % IV SOLN: 500.0000 mL | INTRAVENOUS | Status: DC

## 2020-10-24 NOTE — Progress Notes (Signed)
A and O x3. Report to RN. Tolerated MAC anesthesia well.Teeth unchanged after procedure.

## 2020-10-24 NOTE — Op Note (Signed)
[place] Patient Name: Tiffany Franklin Procedure Date: 10/24/2020 11:00 AM MRN: [id number] Endoscopist: Docia Chuck. Henrene Pastor , MD Age: 75 Referring MD:  Date of Birth: 05-Nov-1945 Gender: Female Account #: [id number] Procedure:                Upper GI endoscopy with biopsies Indications:              Dyspepsia, Abnormal CT of the GI tract (esophageal                            thickening) Medicines:                Monitored Anesthesia Care Procedure:                Pre-Anesthesia Assessment:                           - Prior to the procedure, a History and Physical                            was performed, and patient medications and                            allergies were reviewed. The patient's tolerance of                            previous anesthesia was also reviewed. The risks                            and benefits of the procedure and the sedation                            options and risks were discussed with the patient.                            All questions were answered, and informed consent                            was obtained. Prior Anticoagulants: The patient has                            taken no previous anticoagulant or antiplatelet                            agents. ASA Grade Assessment: II - A patient with                            mild systemic disease. After reviewing the risks                            and benefits, the patient was deemed in                            satisfactory condition to undergo the procedure.  After obtaining informed consent, the endoscope was                            passed under direct vision. Throughout the                            procedure, the patient's blood pressure, pulse, and                            oxygen saturations were monitored continuously. The                            Endoscope was introduced through the mouth, and                            advanced to the second part  of duodenum. The upper                            GI endoscopy was accomplished without difficulty.                            The patient tolerated the procedure well. Scope In: Scope Out: Findings:                 The esophagus was normal.                           The stomach revealed a small sliding hiatal hernia                            and a 2 mm proximal gastric nodule. Biopsies were                            taken with a cold forceps for histology. The                            stomach was otherwise normal                           The examined duodenum was normal.                           The cardia and gastric fundus were normal on                            retroflexion. Complications:            No immediate complications. Estimated Blood Loss:     Estimated blood loss: none. Impression:               - Normal esophagus.                           - Benign-appearing diminutive gastric nodule.  Biopsied.                           - Normal examined duodenum. Recommendation:           - Patient has a contact number available for                            emergencies. The signs and symptoms of potential                            delayed complications were discussed with the                            patient. Return to normal activities tomorrow.                            Written discharge instructions were provided to the                            patient.                           - Resume previous diet.                           - Continue present medications.                           - Follow-up biopsies                           - Return to the care of your primary provider Docia Chuck. Henrene Pastor, MD 10/24/2020 11:25:50 AM This report has been signed electronically.

## 2020-10-24 NOTE — Progress Notes (Signed)
Called to room to assist during endoscopic procedure.  Patient ID and intended procedure confirmed with present staff. Received instructions for my participation in the procedure from the performing physician.  

## 2020-10-24 NOTE — Patient Instructions (Signed)
Handout given for hiatal hernia. ° °YOU HAD AN ENDOSCOPIC PROCEDURE TODAY AT THE [facility]:   Refer to the procedure report that was given to you for any specific questions about what was found during the examination.  If the procedure report does not answer your questions, please call your gastroenterologist to clarify.  If you requested that your care partner not be given the details of your procedure findings, then the procedure report has been included in a sealed envelope for you to review at your convenience later. ° °YOU SHOULD EXPECT: Some feelings of bloating in the abdomen. Passage of more gas than usual.  Walking can help get rid of the air that was put into your GI tract during the procedure and reduce the bloating. If you had a lower endoscopy (such as a colonoscopy or flexible sigmoidoscopy) you may notice spotting of blood in your stool or on the toilet paper. If you underwent a bowel prep for your procedure, you may not have a normal bowel movement for a few days. ° °Please Note:  You might notice some irritation and congestion in your nose or some drainage.  This is from the oxygen used during your procedure.  There is no need for concern and it should clear up in a day or so. ° °SYMPTOMS TO REPORT IMMEDIATELY: ° °Following upper endoscopy (EGD) ° Vomiting of blood or coffee ground material ° New chest pain or pain under the shoulder blades ° Painful or persistently difficult swallowing ° New shortness of breath ° Fever of 100°F or higher ° Black, tarry-looking stools ° °For urgent or emergent issues, a gastroenterologist can be reached at any hour by calling [phone]. °Do not use MyChart messaging for urgent concerns.  ° ° °DIET:  We do recommend a small meal at first, but then you may proceed to your regular diet.  Drink plenty of fluids but you should avoid alcoholic beverages for 24 hours. ° °ACTIVITY:  You should plan to take it easy for the rest of today and you should NOT  DRIVE or use heavy machinery until tomorrow (because of the sedation medicines used during the test).   ° °FOLLOW UP: °Our staff will call the number listed on your records 48-72 hours following your procedure to check on you and address any questions or concerns that you may have regarding the information given to you following your procedure. If we do not reach you, we will leave a message.  We will attempt to reach you two times.  During this call, we will ask if you have developed any symptoms of COVID 19. If you develop any symptoms (ie: fever, flu-like symptoms, shortness of breath, cough etc.) before then, please call [phone].  If you test positive for Covid 19 in the 2 weeks post procedure, please call and report this information to us.   ° °If any biopsies were taken you will be contacted by phone or by letter within the next 1-3 weeks.  Please call us at [phone] if you have not heard about the biopsies in 3 weeks.  ° ° °SIGNATURES/CONFIDENTIALITY: °You and/or your care partner have signed paperwork which will be entered into your electronic medical record.  These signatures attest to the fact that that the information above on your After Visit Summary has been reviewed and is understood.  Full responsibility of the confidentiality of this discharge information lies with you and/or your care-partner.  °

## 2020-10-28 ENCOUNTER — Telehealth: Payer: Self-pay

## 2020-10-28 NOTE — Telephone Encounter (Signed)
  Follow up Call-  Call back number 10/24/2020  Post procedure Call Back phone  # [phone]  Permission to leave phone message Yes  Some recent data might be hidden     Patient questions:  Do you have a fever, pain , or abdominal swelling? No. Pain Score  0 *  Have you tolerated food without any problems? Yes.    Have you been able to return to your normal activities? Yes.    Do you have any questions about your discharge instructions: Diet   No. Medications  No. Follow up visit  No.  Do you have questions or concerns about your Care? No.  Actions: * If pain score is 4 or above: No action needed, pain <4.  1. Have you developed a fever since your procedure? No  2.   Have you had an respiratory symptoms (SOB or cough) since your procedure? No 3.   Have you tested positive for COVID 19 since your procedure No 4.   Have you had any family members/close contacts diagnosed with the COVID 19 since your procedure? No  If yes to any of these questions please route to Joylene John, RN and Joella Prince, RN

## 2020-10-31 ENCOUNTER — Encounter: Payer: Self-pay | Admitting: Internal Medicine

## 2020-11-12 ENCOUNTER — Encounter: Payer: Self-pay | Admitting: Internal Medicine

## 2020-11-12 ENCOUNTER — Ambulatory Visit: Payer: PPO | Admitting: Internal Medicine

## 2020-11-12 ENCOUNTER — Other Ambulatory Visit: Payer: Self-pay

## 2020-11-12 VITALS — BP 135/79 | HR 76 | Temp 98.0°F | Ht 66.0 in | Wt 136.0 lb

## 2020-11-12 DIAGNOSIS — A310 Pulmonary mycobacterial infection: Secondary | ICD-10-CM

## 2020-11-12 MED ORDER — ALBUTEROL SULFATE (2.5 MG/3ML) 0.083% IN NEBU: 2.5000 mg | INHALATION_SOLUTION | Freq: Four times a day (QID) | RESPIRATORY_TRACT | 12 refills | Status: AC | PRN

## 2020-11-12 NOTE — Assessment & Plan Note (Addendum)
Patient with clinical, radiologic, and microbiologic diagnosis of pulmonary MAC infection.  Based on her CT scan from June 30, 2020 it appears she has noncavitary nodular bronchiectatic disease and has been on treatment now for a little over 2 months and symptomatically has noted some improvement.  She is otherwise tolerating the 3 drug regimen without notable side effects.  PLAN: . Continue azithromycin 58m TIW, ethambutol 240mkg TIW, and rifampin 6002mIW . Vision testing today stable . Repeat mycobacterial sputum cultures today . Albuterol nebulizer refilled today . RTC 3 months.  Tentatively plan for repeat CT scan around that time

## 2020-11-12 NOTE — Patient Instructions (Signed)
Thank you for coming to see me today. It was a pleasure seeing you.  To Do: Marland Kitchen Labs and repeat sputum cultures . Continue with treatment for lung infection with 3 medication regimen (azithromycin, ethambutol, and rifampin) . Follow up with me in about 3 months  If you have any questions or concerns, please do not hesitate to call the office at [phone].  Take Care,   Jule Ser, DO

## 2020-11-12 NOTE — Addendum Note (Signed)
Addended by: Caffie Pinto on: 11/12/2020 02:27 PM   Modules accepted: Orders

## 2020-11-12 NOTE — Progress Notes (Signed)
[facility]  CHIEF COMPLAINT:    Follow up for pulmonary mycobacterial infection  SUBJECTIVE:    Tiffany Franklin is a 75 y.o. female with PMHx as below who presents to the clinic for follow-up of pulmonary mycobacterial infection.   She was previously seen by me in November 2021 for initial consultation.  Sputum cultures at that time were obtained and positive for MAC.  Initial CBC and CMP were unremarkable.  EKG obtained on that day showed a QTC of 418 and baseline vision testing 20/20 with glasses and 20/40 without glasses.  She was started on 3 drug therapy with azithromycin 500 mg 3 times weekly, ethambutol 25 mg/kg 3 times weekly, and rifampin 600 mg 3 times weekly.  She returns today for follow-up.  In the interval since seeing me, she underwent EGD with gastroenterology which was unremarkable.  She continues to take her 3 drug regimen without complications thus far.  Doing well overall, cough improved.  Taking 3 drugs as prescribed and tolerating thus far.   Please see A&P for the details of today's visit and status of the patient's medical problems.   Patient's Medications  New Prescriptions   ALBUTEROL (PROVENTIL) (2.5 MG/3ML) 0.083% NEBULIZER SOLUTION    Take 3 mLs (2.5 mg total) by nebulization every 6 (six) hours as needed for wheezing or shortness of breath.  Previous Medications   ACETAMINOPHEN (TYLENOL) 500 MG TABLET    Take 500 mg by mouth every 6 (six) hours as needed (patient took 1/2 pill).   ALBUTEROL (VENTOLIN HFA) 108 (90 BASE) MCG/ACT INHALER    Inhale 2 puffs into the lungs every 6 (six) hours as needed for wheezing or shortness of breath.   ALPRAZOLAM (XANAX) 0.25 MG TABLET    Take 0.25 mg by mouth daily as needed for anxiety.   ARTIFICIAL TEAR SOLUTION (TEARS NATURALE OP)    Place 1-2 drops into both eyes 3 (three) times daily as needed (dry/irritated eyes.).   AZITHROMYCIN (ZITHROMAX) 500 MG TABLET    Take 1 tablet (500 mg  total) by mouth 3 (three) times a week.   ETHAMBUTOL (MYAMBUTOL) 400 MG TABLET    Take 4 tablets (1,600 mg total) by mouth 3 (three) times a week.   MULTIPLE VITAMIN (MULTIVITAMIN WITH MINERALS) TABS TABLET    Take 1 tablet by mouth daily. Women's 50+   RIFAMPIN (RIFADIN) 300 MG CAPSULE    Take 2 capsules (600 mg total) by mouth 3 (three) times a week.   ZOLPIDEM (AMBIEN) 5 MG TABLET    Take 2.5 mg by mouth at bedtime as needed for sleep.   Modified Medications   No medications on file  Discontinued Medications   No medications on file      Past Medical History:  Diagnosis Date  . Allergy   . Anxiety   . Arthritis   . Cataract of both eyes   . Occasional tremors    hands bilat   . Pneumonia   . Wears glasses     Social History   Tobacco Use  . Smoking status: Never Smoker  . Smokeless tobacco: Never Used  Vaping Use  . Vaping Use: Former  Substance Use Topics  . Alcohol use: Yes    Comment: occ  . Drug use: No    Family History  Problem Relation Age of Onset  . Breast cancer Maternal Aunt   . Breast cancer Paternal Aunt   . Colon  cancer Neg Hx   . Stomach cancer Neg Hx   . Esophageal cancer Neg Hx   . Pancreatic cancer Neg Hx     Allergies  Allergen Reactions  . Demerol [Meperidine] Nausea And Vomiting and Other (See Comments)    Blood pressure becomes elevated.    Review of Systems  Constitutional: Negative for chills and fever.  Eyes: Negative for blurred vision.  Respiratory: Positive for cough and sputum production.   Cardiovascular: Negative.   Gastrointestinal: Negative.      OBJECTIVE:    Vitals:   11/12/20 1359  BP: 135/79  Pulse: 76  Temp: 98 F (36.7 C)  Weight: 136 lb (61.7 kg)  Height: _0  (1.676 m)   Body mass index is 21.95 kg/m.  Physical Exam Constitutional:      Appearance: Normal appearance.  HENT:     Head: Normocephalic and atraumatic.  Pulmonary:     Effort: Pulmonary effort is normal. No respiratory distress.      Breath sounds: Normal breath sounds.  Neurological:     General: No focal deficit present.     Mental Status: She is alert and oriented to person, place, and time.  Psychiatric:        Mood and Affect: Mood normal.        Behavior: Behavior normal.      Labs and Microbiology: CBC Latest Ref Rng & Units 08/12/2020 07/05/2020 07/24/2015  WBC 3.8 - 10.8 Thousand/uL 7.0 5.2 8.7  Hemoglobin 11.7 - 15.5 g/dL 14.3 14.3 13.7  Hematocrit 35.0 - 45.0 % 42.2 42.6 40.2  Platelets 140 - 400 Thousand/uL 304 261.0 218   CMP Latest Ref Rng & Units 08/12/2020 07/24/2015 07/19/2015  Glucose 65 - 99 mg/dL 90 - 92  BUN 7 - 25 mg/dL 12 - 16  Creatinine 0.60 - 0.93 mg/dL 0.57(L) 0.55 0.51  Sodium 135 - 146 mmol/L 140 - 142  Potassium 3.5 - 5.3 mmol/L 4.4 - 5.1  Chloride 98 - 110 mmol/L 105 - 108  CO2 20 - 32 mmol/L 26 - 27  Calcium 8.6 - 10.4 mg/dL 10.1 - 9.9  Total Protein 6.1 - 8.1 g/dL 7.1 - -  Total Bilirubin 0.2 - 1.2 mg/dL 0.5 - -  Alkaline Phos 39 - 117 U/L - - -  AST 10 - 35 U/L 20 - -  ALT 6 - 29 U/L 14 - -     No results found for this or any previous visit (from the past 240 hour(s)).  Imaging: IMPRESSION: 1. Bronchiectasis within the lingula and right middle lobe with associated patchy and ground-glass nodular opacities. Additionally, there are scattered tree-in-bud nodular opacities within the right upper and right lower lobes as well as peripheral tree-in-bud ground-glass nodularity within the left upper and left lower lobes. Findings are nonspecific however may be secondary to an atypical infectious process such as MAI. Consider follow-up chest CT in 3-6 months to assess for interval change/resolution. 2. Mild wall thickening of the esophagus raising the possibility of esophagitis.   ASSESSMENT & PLAN:    Pulmonary mycobacterial infection ([place]) Patient with clinical, radiologic, and microbiologic diagnosis of pulmonary MAC infection.  Based on her CT scan from June 30, 2020 it appears she has noncavitary nodular bronchiectatic disease and has been on treatment now for a little over 2 months and symptomatically has noted some improvement.  She is otherwise tolerating the 3 drug regimen without notable side effects.  PLAN: . Continue azithromycin 552m TIW, ethambutol 250mkg  TIW, and rifampin 644m TIW . Vision testing today stable . Repeat mycobacterial sputum cultures today . Albuterol nebulizer refilled today . RTC 3 months.  Tentatively plan for repeat CT scan around that time    Orders Placed This Encounter  Procedures  . MYCOBACTERIA, CULTURE, WITH FLUOROCHROME SMEAR  . CBC w/Diff  . COMPLETE METABOLIC PANEL WITH GFR       [facility] 11/12/2020, 2:07 PM

## 2020-11-13 ENCOUNTER — Telehealth: Payer: Self-pay

## 2020-11-13 ENCOUNTER — Other Ambulatory Visit: Payer: PPO

## 2020-11-13 DIAGNOSIS — A310 Pulmonary mycobacterial infection: Secondary | ICD-10-CM

## 2020-11-13 LAB — CBC WITH DIFFERENTIAL/PLATELET
Absolute Monocytes: 482 cells/uL (ref 200–950)
Basophils Absolute: 26 cells/uL (ref 0–200)
Basophils Relative: 0.3 %
Eosinophils Absolute: 60 cells/uL (ref 15–500)
Eosinophils Relative: 0.7 %
HCT: 40.8 % (ref 35.0–45.0)
Hemoglobin: 13.7 g/dL (ref 11.7–15.5)
Lymphs Abs: 1410 cells/uL (ref 850–3900)
MCH: 31.7 pg (ref 27.0–33.0)
MCHC: 33.6 g/dL (ref 32.0–36.0)
MCV: 94.4 fL (ref 80.0–100.0)
MPV: 9 fL (ref 7.5–12.5)
Monocytes Relative: 5.6 %
Neutro Abs: 6622 cells/uL (ref 1500–7800)
Neutrophils Relative %: 77 %
Platelets: 227 10*3/uL (ref 140–400)
RBC: 4.32 10*6/uL (ref 3.80–5.10)
RDW: 12.4 % (ref 11.0–15.0)
Total Lymphocyte: 16.4 %
WBC: 8.6 10*3/uL (ref 3.8–10.8)

## 2020-11-13 LAB — COMPLETE METABOLIC PANEL WITH GFR
AG Ratio: 2 (calc) (ref 1.0–2.5)
ALT: 11 U/L (ref 6–29)
AST: 15 U/L (ref 10–35)
Albumin: 4.4 g/dL (ref 3.6–5.1)
Alkaline phosphatase (APISO): 87 U/L (ref 37–153)
BUN: 16 mg/dL (ref 7–25)
CO2: 27 mmol/L (ref 20–32)
Calcium: 9.8 mg/dL (ref 8.6–10.4)
Chloride: 107 mmol/L (ref 98–110)
Creat: 0.84 mg/dL (ref 0.60–0.93)
GFR, Est African American: 79 mL/min/{1.73_m2} (ref >=60)
GFR, Est Non African American: 68 mL/min/{1.73_m2} (ref >=60)
Globulin: 2.2 g/dL (calc) (ref 1.9–3.7)
Glucose, Bld: 88 mg/dL (ref 65–99)
Potassium: 4.6 mmol/L (ref 3.5–5.3)
Sodium: 142 mmol/L (ref 135–146)
Total Bilirubin: 0.2 mg/dL (ref 0.2–1.2)
Total Protein: 6.6 g/dL (ref 6.1–8.1)

## 2020-11-13 NOTE — Telephone Encounter (Signed)
-----   Message from Mignon Pine, DO sent at 11/13/2020  8:29 AM EST ----- Can you please let patient know that her blood work was normal and I will update her once the results of her sputum cultures are available  Thanks!

## 2020-11-13 NOTE — Telephone Encounter (Signed)
RN attempted to call patient to relay results per Dr. Juleen China, no answer. Left HIPAA compliant voicemail requesting callback.   Beryle Flock, RN

## 2020-11-13 NOTE — Telephone Encounter (Signed)
Patient returning call, RN relayed per Dr. Juleen China that her blood work is normal and we will update her once the results of her sputum cultures are available. Patient verbalized understanding and has no further questions. She states she will drop off her sputum sample this morning.  Beryle Flock, RN

## 2020-11-14 ENCOUNTER — Telehealth: Payer: Self-pay

## 2020-11-14 NOTE — Telephone Encounter (Signed)
Received call from Zachary at [facility] requesting diagnosis code for patient's albuterol solution. RN provided code of A31.0.   Beryle Flock, RN

## 2020-12-25 ENCOUNTER — Telehealth: Payer: Self-pay | Admitting: Internal Medicine

## 2020-12-25 NOTE — Telephone Encounter (Signed)
error 

## 2021-01-01 LAB — MYCOBACTERIA,CULT W/FLUOROCHROME SMEAR
MICRO NUMBER:: 11516700
SMEAR:: NONE SEEN
SPECIMEN QUALITY:: ADEQUATE

## 2021-01-24 ENCOUNTER — Other Ambulatory Visit: Payer: Self-pay | Admitting: Internal Medicine

## 2021-01-24 DIAGNOSIS — Z1231 Encounter for screening mammogram for malignant neoplasm of breast: Secondary | ICD-10-CM

## 2021-01-29 ENCOUNTER — Ambulatory Visit
Admission: RE | Admit: 2021-01-29 | Discharge: 2021-01-29 | Disposition: A | Discharge status: Home / Self Care | Payer: PPO | Source: Ambulatory Visit | Attending: Internal Medicine | Admitting: Internal Medicine

## 2021-01-29 ENCOUNTER — Other Ambulatory Visit: Payer: Self-pay

## 2021-01-29 DIAGNOSIS — Z1231 Encounter for screening mammogram for malignant neoplasm of breast: Secondary | ICD-10-CM

## 2021-02-03 ENCOUNTER — Encounter: Payer: Self-pay | Admitting: Internal Medicine

## 2021-02-03 ENCOUNTER — Other Ambulatory Visit: Payer: Self-pay

## 2021-02-03 ENCOUNTER — Ambulatory Visit: Payer: PPO | Admitting: Internal Medicine

## 2021-02-03 DIAGNOSIS — A310 Pulmonary mycobacterial infection: Secondary | ICD-10-CM | POA: Diagnosis not present

## 2021-02-03 MED ORDER — ETHAMBUTOL HCL 400 MG PO TABS
1600.0000 mg | ORAL_TABLET | ORAL | 1 refills | Status: DC
Start: 2021-02-03 — End: 2021-05-06

## 2021-02-03 MED ORDER — RIFAMPIN 300 MG PO CAPS: 600.0000 mg | ORAL_CAPSULE | ORAL | 1 refills | Status: DC

## 2021-02-03 MED ORDER — AZITHROMYCIN 500 MG PO TABS: 500.0000 mg | ORAL_TABLET | ORAL | 1 refills | Status: DC

## 2021-02-03 NOTE — Progress Notes (Signed)
[facility]  CHIEF COMPLAINT:    Follow up for pulmonary mycobacterial infection  SUBJECTIVE:    Tiffany Franklin is a 75 y.o. female with PMHx as below who presents to the clinic for follow-up for pulmonary mycobacterial infection.   She has been on 3 drug therapy with azithromycin 500 mg 3 times weekly, ethambutol 25 mg/kg 3 times weekly, and rifampin 600 mg 3 times weekly since November 2021.  Her most recent sputum cultures from February 2022 were negative for Mycobacterium species finalized after 6 weeks of incubation.  She reports some diarrhea with her atbx and some fatigue.  She still coughs and has sputum production but his has improved.  She has no fevers or chills.    Please see A&P for the details of today's visit and status of the patient's medical problems.   Patient's Medications  New Prescriptions   No medications on file  Previous Medications   ACETAMINOPHEN (TYLENOL) 500 MG TABLET    Take 500 mg by mouth every 6 (six) hours as needed (patient took 1/2 pill).   ALBUTEROL (PROVENTIL) (2.5 MG/3ML) 0.083% NEBULIZER SOLUTION    Take 3 mLs (2.5 mg total) by nebulization every 6 (six) hours as needed for wheezing or shortness of breath.   ALBUTEROL (VENTOLIN HFA) 108 (90 BASE) MCG/ACT INHALER    Inhale 2 puffs into the lungs every 6 (six) hours as needed for wheezing or shortness of breath.   ALPRAZOLAM (XANAX) 0.25 MG TABLET    Take 0.25 mg by mouth daily as needed for anxiety.   ARTIFICIAL TEAR SOLUTION (TEARS NATURALE OP)    Place 1-2 drops into both eyes 3 (three) times daily as needed (dry/irritated eyes.).   MULTIPLE VITAMIN (MULTIVITAMIN WITH MINERALS) TABS TABLET    Take 1 tablet by mouth daily. Women's 50+   ZOLPIDEM (AMBIEN) 5 MG TABLET    Take 2.5 mg by mouth at bedtime as needed for sleep.   Modified Medications   Modified Medication Previous Medication   AZITHROMYCIN (ZITHROMAX) 500 MG TABLET azithromycin (ZITHROMAX) 500 MG  tablet      Take 1 tablet (500 mg total) by mouth 3 (three) times a week.    Take 1 tablet (500 mg total) by mouth 3 (three) times a week.   ETHAMBUTOL (MYAMBUTOL) 400 MG TABLET ethambutol (MYAMBUTOL) 400 MG tablet      Take 4 tablets (1,600 mg total) by mouth 3 (three) times a week.    Take 4 tablets (1,600 mg total) by mouth 3 (three) times a week.   RIFAMPIN (RIFADIN) 300 MG CAPSULE rifampin (RIFADIN) 300 MG capsule      Take 2 capsules (600 mg total) by mouth 3 (three) times a week.    Take 2 capsules (600 mg total) by mouth 3 (three) times a week.  Discontinued Medications   No medications on file      Past Medical History:  Diagnosis Date  . Allergy   . Anxiety   . Arthritis   . Cataract of both eyes   . Occasional tremors    hands bilat   . Pneumonia   . Wears glasses     Social History   Tobacco Use  . Smoking status: Never Smoker  . Smokeless tobacco: Never Used  Vaping Use  . Vaping Use: Former  Substance Use Topics  . Alcohol use: Yes    Comment: occ  . Drug use: No  Family History  Problem Relation Age of Onset  . Breast cancer Maternal Aunt   . Breast cancer Paternal Aunt   . Colon cancer Neg Hx   . Stomach cancer Neg Hx   . Esophageal cancer Neg Hx   . Pancreatic cancer Neg Hx     Allergies  Allergen Reactions  . Demerol [Meperidine] Nausea And Vomiting and Other (See Comments)    Blood pressure becomes elevated.    Review of Systems  Constitutional: Positive for malaise/fatigue. Negative for chills and fever.  Respiratory: Positive for cough and sputum production. Negative for shortness of breath.   Gastrointestinal: Positive for diarrhea.     OBJECTIVE:    Vitals:   02/03/21 1342  BP: 122/77  Pulse: 73  SpO2: 97%  Weight: 132 lb (59.9 kg)   Body mass index is 21.31 kg/m.  Physical Exam Constitutional:      General: She is not in acute distress.    Appearance: Normal appearance.  Pulmonary:     Effort: Pulmonary effort is  normal. No respiratory distress.  Neurological:     General: No focal deficit present.     Mental Status: She is alert and oriented to person, place, and time.  Psychiatric:        Mood and Affect: Mood normal.        Behavior: Behavior normal.      Labs and Microbiology: CBC Latest Ref Rng & Units 11/12/2020 08/12/2020 07/05/2020  WBC 3.8 - 10.8 Thousand/uL 8.6 7.0 5.2  Hemoglobin 11.7 - 15.5 g/dL 13.7 14.3 14.3  Hematocrit 35.0 - 45.0 % 40.8 42.2 42.6  Platelets 140 - 400 Thousand/uL 227 304 261.0   CMP Latest Ref Rng & Units 11/12/2020 08/12/2020 07/24/2015  Glucose 65 - 99 mg/dL 88 90 -  BUN 7 - 25 mg/dL 16 12 -  Creatinine 0.60 - 0.93 mg/dL 0.84 0.57(L) 0.55  Sodium 135 - 146 mmol/L 142 140 -  Potassium 3.5 - 5.3 mmol/L 4.6 4.4 -  Chloride 98 - 110 mmol/L 107 105 -  CO2 20 - 32 mmol/L 27 26 -  Calcium 8.6 - 10.4 mg/dL 9.8 10.1 -  Total Protein 6.1 - 8.1 g/dL 6.6 7.1 -  Total Bilirubin 0.2 - 1.2 mg/dL 0.2 0.5 -  Alkaline Phos 39 - 117 U/L - - -  AST 10 - 35 U/L 15 20 -  ALT 6 - 29 U/L 11 14 -       ASSESSMENT & PLAN:    Pulmonary mycobacterial infection ([facility]) Patient with clinical, radiologic, and microbiologic diagnosis of pulmonary MAC infection started on 3 drug regimen of azithromycin, ethambutol, and rifampin in November 2021 with microbiologic response to therapy noted in February 2022 with negative AFB sputum cultures.  Will repeat safety monitoring labs (CBC, CMP) and sputum cultures today.  Will also obtain follow-up chest CT for radiographic monitoring and advised pulmonary follow up for continued recommendations on airway clearance.  Refills sent today.  RTC 3 months.   Orders Placed This Encounter  Procedures  . MYCOBACTERIA, CULTURE, WITH FLUOROCHROME SMEAR    Standing Status:   Future    Standing Expiration Date:   02/03/2022  . CT CHEST WO CONTRAST    Standing Status:   Future    Standing Expiration Date:   02/03/2022    Order Specific Question:   Preferred  imaging location?    Answer:   [facility]  . CBC  . COMPLETE METABOLIC PANEL WITH GFR  [facility] 02/03/2021, 2:09 PM   I spent 30 minutes dedicated to the care of this patient on the date of this encounter to include pre-visit review of records, face-to-face time with the patient discussing MAC infection, and post-visit ordering of testing.

## 2021-02-03 NOTE — Assessment & Plan Note (Addendum)
Patient with clinical, radiologic, and microbiologic diagnosis of pulmonary MAC infection started on 3 drug regimen of azithromycin, ethambutol, and rifampin in November 2021 with microbiologic response to therapy noted in February 2022 with negative AFB sputum cultures.  Will repeat safety monitoring labs (CBC, CMP) and sputum cultures today.  Will also obtain follow-up chest CT for radiographic monitoring and advised pulmonary follow up for continued recommendations on airway clearance.  Refills sent today.  RTC 3 months.

## 2021-02-03 NOTE — Patient Instructions (Addendum)
Thank you for coming to see me today. It was a pleasure seeing you.  To Do: Marland Kitchen Continue azithromycin, rifampin, ethambutol 3 times per week.  I sent refills . Labs today and sputum sample . Repeat CT chest in about 4 weeks or so . Follow up with Dr Vaughan Browner regarding any new recommendations on airway clearance techniques  If you have any questions or concerns, please do not hesitate to call the office at [phone].  Take Care,   Jule Ser, DO

## 2021-02-04 LAB — CBC
HCT: 39.2 % (ref 35.0–45.0)
Hemoglobin: 13.1 g/dL (ref 11.7–15.5)
MCH: 31.8 pg (ref 27.0–33.0)
MCHC: 33.4 g/dL (ref 32.0–36.0)
MCV: 95.1 fL (ref 80.0–100.0)
MPV: 8.6 fL (ref 7.5–12.5)
Platelets: 201 10*3/uL (ref 140–400)
RBC: 4.12 10*6/uL (ref 3.80–5.10)
RDW: 13.1 % (ref 11.0–15.0)
WBC: 6.6 10*3/uL (ref 3.8–10.8)

## 2021-02-04 LAB — COMPLETE METABOLIC PANEL WITH GFR
AG Ratio: 2 (calc) (ref 1.0–2.5)
ALT: 10 U/L (ref 6–29)
AST: 16 U/L (ref 10–35)
Albumin: 4.3 g/dL (ref 3.6–5.1)
Alkaline phosphatase (APISO): 85 U/L (ref 37–153)
BUN: 12 mg/dL (ref 7–25)
CO2: 27 mmol/L (ref 20–32)
Calcium: 9.6 mg/dL (ref 8.6–10.4)
Chloride: 107 mmol/L (ref 98–110)
Creat: 0.71 mg/dL (ref 0.60–0.93)
GFR, Est African American: 97 mL/min/{1.73_m2} (ref >=60)
GFR, Est Non African American: 83 mL/min/{1.73_m2} (ref >=60)
Globulin: 2.2 g/dL (calc) (ref 1.9–3.7)
Glucose, Bld: 100 mg/dL — ABNORMAL HIGH (ref 65–99)
Potassium: 4.5 mmol/L (ref 3.5–5.3)
Sodium: 142 mmol/L (ref 135–146)
Total Bilirubin: 1.6 mg/dL — ABNORMAL HIGH (ref 0.2–1.2)
Total Protein: 6.5 g/dL (ref 6.1–8.1)

## 2021-02-05 ENCOUNTER — Ambulatory Visit: Payer: PPO | Admitting: Internal Medicine

## 2021-02-10 ENCOUNTER — Other Ambulatory Visit: Payer: Self-pay

## 2021-02-10 ENCOUNTER — Other Ambulatory Visit: Payer: PPO

## 2021-02-10 DIAGNOSIS — A310 Pulmonary mycobacterial infection: Secondary | ICD-10-CM

## 2021-02-12 ENCOUNTER — Other Ambulatory Visit: Payer: Self-pay

## 2021-02-12 ENCOUNTER — Ambulatory Visit (HOSPITAL_COMMUNITY)
Admission: RE | Admit: 2021-02-12 | Discharge: 2021-02-12 | Disposition: A | Discharge status: Home / Self Care | Payer: PPO | Source: Ambulatory Visit | Attending: Internal Medicine | Admitting: Internal Medicine

## 2021-02-12 DIAGNOSIS — J479 Bronchiectasis, uncomplicated: Secondary | ICD-10-CM | POA: Diagnosis not present

## 2021-02-12 DIAGNOSIS — A310 Pulmonary mycobacterial infection: Secondary | ICD-10-CM | POA: Diagnosis not present

## 2021-02-12 DIAGNOSIS — I898 Other specified noninfective disorders of lymphatic vessels and lymph nodes: Secondary | ICD-10-CM | POA: Diagnosis not present

## 2021-02-12 DIAGNOSIS — M47814 Spondylosis without myelopathy or radiculopathy, thoracic region: Secondary | ICD-10-CM | POA: Diagnosis not present

## 2021-02-12 DIAGNOSIS — Z8701 Personal history of pneumonia (recurrent): Secondary | ICD-10-CM | POA: Diagnosis not present

## 2021-02-19 ENCOUNTER — Telehealth: Payer: Self-pay

## 2021-02-19 NOTE — Telephone Encounter (Signed)
Yes, okay with me.

## 2021-02-19 NOTE — Telephone Encounter (Signed)
-----   Message from Mignon Pine, DO sent at 02/19/2021  3:34 PM EDT ----- Please let patient know that her interval CT scan from last week showed stability of her MAC infection and no interval worsening which is good news.  Her AFB cultures also remain negative.  She should continue with her antibiotic  regimen .  Thanks, Mitzi Hansen

## 2021-02-19 NOTE — Telephone Encounter (Signed)
Patient given CT results and sputum results. Patient reports using albuterol inhaler daily with reports that her breathing improves. Wanted to confirm ok to use with current regimen. Stated that if it is ok, no need to let her know; she'll continue.   Darcell Sabino Lorita Officer, RN

## 2021-02-24 ENCOUNTER — Telehealth: Payer: Self-pay

## 2021-02-24 DIAGNOSIS — U071 COVID-19: Secondary | ICD-10-CM

## 2021-02-24 NOTE — Telephone Encounter (Signed)
Patient called, stated that Dr. Virgina Jock sent in some "COVID pills" for her. Patient is unsure of the name and wants to confirm that it is okay to take with her MAC antibiotics.   RN spoke with Janene Madeira, NP who states patient should not take Paxlovid with her MAC regimen, but that it would be okay for her to take molnupiravir. Relayed this to the patient. Also advised patient that a referral for monoclonal antibody treatment has been put in for her and that Colletta Maryland recommends she wait on taking any oral COIVD medication until she speaks to a provider regarding MAB.   Patient verbalized understanding, states she will wait to hear about the MAB infusion before taking any oral COVID medications. Advised patient to call with further questions.   Beryle Flock, RN

## 2021-02-24 NOTE — Telephone Encounter (Signed)
Unfortunately she is not eligible to get Paxlovid due to interaction with her MAC treatment (rifampin specifically).  I have reached out to the [facility] folks and they will put her into their spread sheet work flow.  Someone from their team may be reaching out to her about scheduling monoclonal infusion but I am not sure on the timing.   Thanks, Mitzi Hansen

## 2021-02-24 NOTE — Telephone Encounter (Signed)
Patient calls to report that she took a home covid test today and tested positive for Covid-19. Patient reached out to PCP who suggested patient contact ID for advise being that she is under ID care for Pulmonary Mycobacterial Infection ( Rifampin, Azithromycin, Ethambutol ) Patient states she has continued taking her antibiotics would like to know if she is a candidate for any mon antibody therapy or antivirals for oral covid medications.  Routing to MD for advise.  Tiffany Franklin

## 2021-02-24 NOTE — Addendum Note (Signed)
Addended by: Colma Callas on: 02/24/2021 03:03 PM   Modules accepted: Orders

## 2021-02-25 ENCOUNTER — Telehealth: Payer: Self-pay

## 2021-02-25 ENCOUNTER — Other Ambulatory Visit: Payer: Self-pay | Admitting: Adult Health

## 2021-02-25 DIAGNOSIS — J1282 Pneumonia due to coronavirus disease 2019: Secondary | ICD-10-CM

## 2021-02-25 DIAGNOSIS — U071 COVID-19: Secondary | ICD-10-CM

## 2021-02-25 NOTE — Progress Notes (Signed)
I connected by phone with Tiffany Franklin on 02/25/2021 at 8:48 AM to discuss the potential use of a new treatment for mild to moderate COVID-19 viral infection in non-hospitalized patients.  This patient is a 75 y.o. female that meets the FDA criteria for Emergency Use Authorization of COVID monoclonal antibody bebtelovimab.  Has a (+) direct SARS-CoV-2 viral test result  Has mild or moderate COVID-19   Is NOT hospitalized due to COVID-19  Is within 10 days of symptom onset  Has at least one of the high risk factor(s) for progression to severe COVID-19 and/or hospitalization as defined in EUA.  Specific high risk criteria : Older age (>/= 76 yo) and Immunosuppressive Disease or Treatment   I have spoken and communicated the following to the patient or parent/caregiver regarding COVID monoclonal antibody treatment:  1. FDA has authorized the emergency use for the treatment of mild to moderate COVID-19 in adults and pediatric patients with positive results of direct SARS-CoV-2 viral testing who are 59 years of age and older weighing at least 40 kg, and who are at high risk for progressing to severe COVID-19 and/or hospitalization.  2. The significant known and potential risks and benefits of COVID monoclonal antibody, and the extent to which such potential risks and benefits are unknown.  3. Information on available alternative treatments and the risks and benefits of those alternatives, including clinical trials.  4. Patients treated with COVID monoclonal antibody should continue to self-isolate and use infection control measures (e.g., wear mask, isolate, social distance, avoid sharing personal items, clean and disinfect "high touch" surfaces, and frequent handwashing) according to CDC guidelines.   5. The patient or parent/caregiver has the option to accept or refuse COVID monoclonal antibody treatment.  6. Discussion about the monoclonal antibody infusion does not ensure treatment.  The patient will be placed on a list and scheduled according to risk, symptom onset and availability. A scheduler will reach to the patient to let them know if we can accommodate their infusion or not.  After reviewing this information with the patient, the patient has agreed to receive one of the available covid 19 monoclonal antibodies and will be provided an appropriate fact sheet prior to infusion.  She is very symptomatic with recent MAC infection, unable to take Paxlovid due to drug interaction with Rifampin.  I have recommended a monoclonal antibody infusion and have reached out to our treatment team to get her scheduled this afternoon or tomorrow afternoon.  Scot Dock, NP 02/25/2021 8:48 AM

## 2021-02-25 NOTE — Telephone Encounter (Signed)
Patient has had PCP send in Nongonococcal therapy  instead as pharmacy had just told her that this med would be non effective in fighting covid.

## 2021-02-25 NOTE — Telephone Encounter (Signed)
Yes, those are contraindicated and should NOT be taken together. Patient must be off of rifampin for at least 2 weeks before taking paxlovid. The concentrations of paxlovid will be so low that it would render the medication ineffective against COVID. An alternate medication for COVID should be prescribed.

## 2021-02-25 NOTE — Telephone Encounter (Signed)
Patient left vm asking of Rifampin and Paxolid have any reactions when taken together. I have sent this to pharmacy to review and advise.

## 2021-02-26 ENCOUNTER — Ambulatory Visit (INDEPENDENT_AMBULATORY_CARE_PROVIDER_SITE_OTHER): Payer: PPO

## 2021-02-26 ENCOUNTER — Other Ambulatory Visit: Payer: Self-pay

## 2021-02-26 DIAGNOSIS — J1282 Pneumonia due to coronavirus disease 2019: Secondary | ICD-10-CM | POA: Diagnosis not present

## 2021-02-26 DIAGNOSIS — U071 COVID-19: Secondary | ICD-10-CM | POA: Diagnosis not present

## 2021-02-26 MED ORDER — SODIUM CHLORIDE 0.9 % IV SOLN: INTRAVENOUS | Status: DC | PRN

## 2021-02-26 MED ORDER — ALBUTEROL SULFATE HFA 108 (90 BASE) MCG/ACT IN AERS: 2.0000 | INHALATION_SPRAY | Freq: Once | RESPIRATORY_TRACT | Status: AC | PRN

## 2021-02-26 MED ORDER — METHYLPREDNISOLONE SODIUM SUCC 125 MG IJ SOLR: 125.0000 mg | Freq: Once | INTRAMUSCULAR | Status: AC | PRN

## 2021-02-26 MED ORDER — FAMOTIDINE IN NACL 20-0.9 MG/50ML-% IV SOLN: 20.0000 mg | Freq: Once | INTRAVENOUS | Status: AC | PRN

## 2021-02-26 MED ORDER — EPINEPHRINE 0.3 MG/0.3ML IJ SOAJ: 0.3000 mg | Freq: Once | INTRAMUSCULAR | Status: AC | PRN

## 2021-02-26 MED ORDER — DIPHENHYDRAMINE HCL 50 MG/ML IJ SOLN: 50.0000 mg | Freq: Once | INTRAMUSCULAR | Status: AC | PRN

## 2021-02-26 MED ORDER — BEBTELOVIMAB 175 MG/2 ML IV (EUA)
175.0000 mg | Freq: Once | INTRAMUSCULAR | Status: AC
  Administered 2021-02-26: 175 mg via INTRAVENOUS

## 2021-02-26 NOTE — Patient Instructions (Signed)

## 2021-02-26 NOTE — Progress Notes (Signed)
Diagnosis: COVID  Provider:  Marshell Garfinkel, MD  Procedure: Infusion  IV Type: Peripheral, IV Location: R Hand  Bebtelovimab, Dose: 175 mg  Infusion Start Time: 1430  Infusion Stop Time: 9021  Post Infusion IV Care: Observation period completed and Peripheral IV Discontinued  Discharge: Condition: Good, Destination: Home . AVS provided to patient.   Performed by:  Arnoldo Morale, RN

## 2021-03-14 DIAGNOSIS — R5383 Other fatigue: Secondary | ICD-10-CM | POA: Diagnosis not present

## 2021-03-14 DIAGNOSIS — R059 Cough, unspecified: Secondary | ICD-10-CM | POA: Diagnosis not present

## 2021-03-14 DIAGNOSIS — I499 Cardiac arrhythmia, unspecified: Secondary | ICD-10-CM | POA: Diagnosis not present

## 2021-03-14 DIAGNOSIS — R6883 Chills (without fever): Secondary | ICD-10-CM | POA: Diagnosis not present

## 2021-03-14 DIAGNOSIS — R0981 Nasal congestion: Secondary | ICD-10-CM | POA: Diagnosis not present

## 2021-03-14 DIAGNOSIS — R0602 Shortness of breath: Secondary | ICD-10-CM | POA: Diagnosis not present

## 2021-03-14 DIAGNOSIS — R0989 Other specified symptoms and signs involving the circulatory and respiratory systems: Secondary | ICD-10-CM | POA: Diagnosis not present

## 2021-03-14 DIAGNOSIS — Z8615 Personal history of latent tuberculosis infection: Secondary | ICD-10-CM | POA: Diagnosis not present

## 2021-03-14 DIAGNOSIS — Z6820 Body mass index (BMI) 20.0-20.9, adult: Secondary | ICD-10-CM | POA: Diagnosis not present

## 2021-03-14 DIAGNOSIS — E559 Vitamin D deficiency, unspecified: Secondary | ICD-10-CM | POA: Diagnosis not present

## 2021-03-14 DIAGNOSIS — Z79899 Other long term (current) drug therapy: Secondary | ICD-10-CM | POA: Diagnosis not present

## 2021-03-14 DIAGNOSIS — R5381 Other malaise: Secondary | ICD-10-CM | POA: Diagnosis not present

## 2021-03-19 DIAGNOSIS — I499 Cardiac arrhythmia, unspecified: Secondary | ICD-10-CM | POA: Diagnosis not present

## 2021-04-09 ENCOUNTER — Other Ambulatory Visit (HOSPITAL_COMMUNITY): Payer: Self-pay

## 2021-04-24 DIAGNOSIS — E559 Vitamin D deficiency, unspecified: Secondary | ICD-10-CM | POA: Diagnosis not present

## 2021-04-24 DIAGNOSIS — R739 Hyperglycemia, unspecified: Secondary | ICD-10-CM | POA: Diagnosis not present

## 2021-04-24 DIAGNOSIS — Z6820 Body mass index (BMI) 20.0-20.9, adult: Secondary | ICD-10-CM | POA: Diagnosis not present

## 2021-04-24 DIAGNOSIS — Z20828 Contact with and (suspected) exposure to other viral communicable diseases: Secondary | ICD-10-CM | POA: Diagnosis not present

## 2021-04-24 DIAGNOSIS — R5383 Other fatigue: Secondary | ICD-10-CM | POA: Diagnosis not present

## 2021-05-06 ENCOUNTER — Ambulatory Visit (INDEPENDENT_AMBULATORY_CARE_PROVIDER_SITE_OTHER): Payer: PPO | Admitting: Internal Medicine

## 2021-05-06 ENCOUNTER — Encounter: Payer: Self-pay | Admitting: Internal Medicine

## 2021-05-06 ENCOUNTER — Other Ambulatory Visit: Payer: Self-pay

## 2021-05-06 VITALS — BP 130/84 | HR 67 | Temp 97.6°F | Wt 129.6 lb

## 2021-05-06 DIAGNOSIS — A310 Pulmonary mycobacterial infection: Secondary | ICD-10-CM

## 2021-05-06 MED ORDER — RIFAMPIN 300 MG PO CAPS: 600.0000 mg | ORAL_CAPSULE | ORAL | 1 refills | Status: AC

## 2021-05-06 MED ORDER — AZITHROMYCIN 500 MG PO TABS: 500.0000 mg | ORAL_TABLET | ORAL | 1 refills | Status: DC

## 2021-05-06 MED ORDER — ETHAMBUTOL HCL 400 MG PO TABS: 1600.0000 mg | ORAL_TABLET | ORAL | 1 refills | Status: AC

## 2021-05-06 NOTE — Patient Instructions (Addendum)
Thank you for coming to see me today. It was a pleasure seeing you.  To Do: Labs today and sputum samples if able Continue with 3 antibiotics on Mon, Wed, and Fri Follow up in 3 months  If you have any questions or concerns, please do not hesitate to call the office at [phone].  Take Care,   Jule Ser

## 2021-05-06 NOTE — Progress Notes (Signed)
[place] for [facility]  CHIEF COMPLAINT:    Follow up for pulmonary MAC  SUBJECTIVE:    Tiffany Franklin is a 75 y.o. female with PMHx as below who presents to the clinic for follow up pulmonary MAC.   Most recently seen by me on 02/03/21. She has been on 3 drug therapy with azithromycin 567m TIW, ethambutol 23mkg TIW, and rifampin 60062mIW since 08/2020.  Sputum cultures from February 2022 were negative.  She had a repeat CT scan done on 02/12/21 that showed little change in appearance of lungs with areas of bronchiectasis, mucus plugging, and tree in bud opacities c/w MAC.  There was no interval worsening.  Repeat AFB cultures have isolated Mycobacterium immunogenum (ID by MALDI-TOF) which has similar resistance profile as M abscessus.  Susceptibilities are pending.     Pulmonary symptoms are better.  Still feels very fatigued and tired.  Tolerating abx with some GI upset.  Getting vitamin B12 and D from her PCP and [place] but is now better from this.   Please see A&P for the details of today's visit and status of the patient's medical problems.   Patient's Medications  New Prescriptions   No medications on file  Previous Medications   ACETAMINOPHEN (TYLENOL) 500 MG TABLET    Take 500 mg by mouth every 6 (six) hours as needed (patient took 1/2 pill).   ALBUTEROL (PROVENTIL) (2.5 MG/3ML) 0.083% NEBULIZER SOLUTION    Take 3 mLs (2.5 mg total) by nebulization every 6 (six) hours as needed for wheezing or shortness of breath.   ALBUTEROL (VENTOLIN HFA) 108 (90 BASE) MCG/ACT INHALER    Inhale 2 puffs into the lungs every 6 (six) hours as needed for wheezing or shortness of breath.   ALPRAZOLAM (XANAX) 0.25 MG TABLET    Take 0.25 mg by mouth daily as needed for anxiety.   ARTIFICIAL TEAR SOLUTION (TEARS NATURALE OP)    Place 1-2 drops into both eyes 3 (three) times daily as needed (dry/irritated eyes.).    CYANOCOBALAMIN (,VITAMIN B-12,) 1000 MCG/ML INJECTION    Inject 2,000 mcg into the muscle every 14 (fourteen) days.   MULTIPLE VITAMIN (MULTIVITAMIN WITH MINERALS) TABS TABLET    Take 1 tablet by mouth daily. Women's 50+   VITAMIN D, ERGOCALCIFEROL, (DRISDOL) 1.25 MG (50000 UNIT) CAPS CAPSULE    Take 50,000 Units by mouth 2 (two) times a week.   ZOLPIDEM (AMBIEN) 5 MG TABLET    Take 2.5 mg by mouth at bedtime as needed for sleep.   Modified Medications   Modified Medication Previous Medication   AZITHROMYCIN (ZITHROMAX) 500 MG TABLET azithromycin (ZITHROMAX) 500 MG tablet      Take 1 tablet (500 mg total) by mouth 3 (three) times a week.    Take 1 tablet (500 mg total) by mouth 3 (three) times a week.   ETHAMBUTOL (MYAMBUTOL) 400 MG TABLET ethambutol (MYAMBUTOL) 400 MG tablet      Take 4 tablets (1,600 mg total) by mouth 3 (three) times a week.    Take 4 tablets (1,600 mg total) by mouth 3 (three) times a week.   RIFAMPIN (RIFADIN) 300 MG CAPSULE rifampin (RIFADIN) 300 MG capsule      Take 2 capsules (600 mg total) by mouth 3 (three) times a week.    Take 2 capsules (600 mg total) by mouth 3 (three) times a  week.  Discontinued Medications   No medications on file      Past Medical History:  Diagnosis Date   Allergy    Anxiety    Arthritis    Cataract of both eyes    Occasional tremors    hands bilat    Pneumonia    Wears glasses     Social History   Tobacco Use   Smoking status: Never   Smokeless tobacco: Never  Vaping Use   Vaping Use: Former  Substance Use Topics   Alcohol use: Yes    Comment: occ   Drug use: No    Family History  Problem Relation Age of Onset   Breast cancer Maternal Aunt    Breast cancer Paternal Aunt    Colon cancer Neg Hx    Stomach cancer Neg Hx    Esophageal cancer Neg Hx    Pancreatic cancer Neg Hx     Allergies  Allergen Reactions   Demerol [Meperidine] Nausea And Vomiting and Other (See Comments)    Blood pressure becomes elevated.     Review of Systems  Constitutional:  Positive for malaise/fatigue. Negative for chills and fever.  Respiratory:         Stable respiratory symtpoms.   Cardiovascular: Negative.   Musculoskeletal:        + occasional right rib pain.   Skin: Negative.     OBJECTIVE:    Vitals:   05/06/21 0931  BP: 130/84  Pulse: 67  Temp: 97.6 F (36.4 C)  TempSrc: Oral  SpO2: 99%  Weight: 129 lb 9.6 oz (58.8 kg)   Body mass index is 20.92 kg/m.  Physical Exam Constitutional:      General: She is not in acute distress.    Appearance: Normal appearance.  HENT:     Head: Normocephalic and atraumatic.  Pulmonary:     Effort: Pulmonary effort is normal. No respiratory distress.  Skin:    General: Skin is warm and dry.     Findings: No rash.  Neurological:     General: No focal deficit present.     Mental Status: She is alert and oriented to person, place, and time.  Psychiatric:        Mood and Affect: Mood normal.        Behavior: Behavior normal.     Labs and Microbiology: CBC Latest Ref Rng & Units 02/03/2021 11/12/2020 08/12/2020  WBC 3.8 - 10.8 Thousand/uL 6.6 8.6 7.0  Hemoglobin 11.7 - 15.5 g/dL 13.1 13.7 14.3  Hematocrit 35.0 - 45.0 % 39.2 40.8 42.2  Platelets 140 - 400 Thousand/uL 201 227 304   CMP Latest Ref Rng & Units 02/03/2021 11/12/2020 08/12/2020  Glucose 65 - 99 mg/dL 100(H) 88 90  BUN 7 - 25 mg/dL _0 Creatinine 0.60 - 0.93 mg/dL 0.71 0.84 0.57(L)  Sodium 135 - 146 mmol/L 142 142 140  Potassium 3.5 - 5.3 mmol/L 4.5 4.6 4.4  Chloride 98 - 110 mmol/L 107 107 105  CO2 20 - 32 mmol/L _1 Calcium 8.6 - 10.4 mg/dL 9.6 9.8 10.1  Total Protein 6.1 - 8.1 g/dL 6.5 6.6 7.1  Total Bilirubin 0.2 - 1.2 mg/dL 1.6(H) 0.2 0.5  Alkaline Phos 39 - 117 U/L - - -  AST 10 - 35 U/L _2 ALT 6 - 29 U/L _3 ASSESSMENT & PLAN:    Pulmonary mycobacterial infection (HCC) Patient with  clinical, radiologic, and microbiologic diagnosis of pulmonary MAC on 3  drug regimen of azithromycin, ethambutol, rifampin since Nov 2021.  Noted microbiologic response in February 2022 with negative AFB cultures and radiographic stability in May 2022.  Her AFB cultures in May have isolated a separate NTM species (M immunogenum) which is unclear if new infection vs colonization.  Will continue with 3 drug regimen at TIW dosing for now, repeat AFB sputum x 3, and check safety labs.  If she has recurrence of MAC, would change to daily dosing and add inhaled amikacin.  Advise pulmonary follow up for continued recommendations on airway clearance.  RTC 3 months.   Orders Placed This Encounter  Procedures   MYCOBACTERIA, CULTURE, WITH FLUOROCHROME SMEAR   MYCOBACTERIA, CULTURE, WITH FLUOROCHROME SMEAR   MYCOBACTERIA, CULTURE, WITH FLUOROCHROME SMEAR   CBC   COMPLETE METABOLIC PANEL WITH GFR        [facility] 05/06/2021, 9:56 AM  I spent 30 minutes dedicated to the care of this patient on the date of this encounter to include pre-visit review of records, face-to-face time with the patient discussing MAC infection, and post-visit ordering of testing.

## 2021-05-06 NOTE — Assessment & Plan Note (Signed)
Patient with clinical, radiologic, and microbiologic diagnosis of pulmonary MAC on 3 drug regimen of azithromycin, ethambutol, rifampin since Nov 2021.  Noted microbiologic response in February 2022 with negative AFB cultures and radiographic stability in May 2022.  Her AFB cultures in May have isolated a separate NTM species (M immunogenum) which is unclear if new infection vs colonization.  Will continue with 3 drug regimen at TIW dosing for now, repeat AFB sputum x 3, and check safety labs.  If she has recurrence of MAC, would change to daily dosing and add inhaled amikacin.  Advise pulmonary follow up for continued recommendations on airway clearance.  RTC 3 months.

## 2021-05-07 ENCOUNTER — Telehealth: Payer: Self-pay

## 2021-05-07 NOTE — Telephone Encounter (Signed)
Relayed to patient per Dr. Juleen China that her labs are stable and he will update her once there is any news on her sputum culture. Patient verbalized understanding and has no further questions.   Beryle Flock, RN

## 2021-05-07 NOTE — Telephone Encounter (Signed)
-----   Message from Mignon Pine, DO sent at 05/07/2021  2:15 PM EDT ----- Can you please let patient know that her labs were stable.  I will update her once there is any update on her AFB Sputum cultures.  Thanks!

## 2021-05-16 LAB — SUSCEPT, AFB RAPID
Amikacin: 16 ug/mL
Ceoxitin: >128 ug/mL
Ciprofloxacin: 2 ug/mL
Clarithromycin: 0.5 ug/mL
Doxycycline: >8 ug/mL
Imipenem: 32 ug/mL
Linezolid: 32 ug/mL
Moxifloxxacin: 4 ug/mL
Tigecycline: 0.5 ug/mL

## 2021-05-16 LAB — CP MYCOBACTERIAL IDENTIFICATION

## 2021-05-16 LAB — MYCOBACTERIA,CULT W/FLUOROCHROME SMEAR
MICRO NUMBER:: 11869541
SMEAR:: NONE SEEN
SPECIMEN QUALITY:: ADEQUATE

## 2021-06-20 LAB — CBC
HCT: 40.5 % (ref 35.0–45.0)
Hemoglobin: 13.6 g/dL (ref 11.7–15.5)
MCH: 31.5 pg (ref 27.0–33.0)
MCHC: 33.6 g/dL (ref 32.0–36.0)
MCV: 93.8 fL (ref 80.0–100.0)
MPV: 8.6 fL (ref 7.5–12.5)
Platelets: 257 10*3/uL (ref 140–400)
RBC: 4.32 10*6/uL (ref 3.80–5.10)
RDW: 12.9 % (ref 11.0–15.0)
WBC: 4.6 10*3/uL (ref 3.8–10.8)

## 2021-06-20 LAB — COMPLETE METABOLIC PANEL WITH GFR
AG Ratio: 1.6 (calc) (ref 1.0–2.5)
ALT: 10 U/L (ref 6–29)
AST: 15 U/L (ref 10–35)
Albumin: 4.1 g/dL (ref 3.6–5.1)
Alkaline phosphatase (APISO): 95 U/L (ref 37–153)
BUN: 9 mg/dL (ref 7–25)
CO2: 28 mmol/L (ref 20–32)
Calcium: 9.5 mg/dL (ref 8.6–10.4)
Chloride: 106 mmol/L (ref 98–110)
Creat: 0.6 mg/dL (ref 0.60–1.00)
Globulin: 2.5 g/dL (calc) (ref 1.9–3.7)
Glucose, Bld: 77 mg/dL (ref 65–99)
Potassium: 4.9 mmol/L (ref 3.5–5.3)
Sodium: 142 mmol/L (ref 135–146)
Total Bilirubin: 0.5 mg/dL (ref 0.2–1.2)
Total Protein: 6.6 g/dL (ref 6.1–8.1)
eGFR: 93 mL/min/{1.73_m2} (ref >=60)

## 2021-06-20 LAB — MYCOBACTERIA,CULT W/FLUOROCHROME SMEAR
MICRO NUMBER:: 12192594
SMEAR:: NONE SEEN
SPECIMEN QUALITY:: ADEQUATE

## 2021-07-21 ENCOUNTER — Other Ambulatory Visit: Payer: Self-pay | Admitting: Internal Medicine

## 2021-07-21 DIAGNOSIS — A310 Pulmonary mycobacterial infection: Secondary | ICD-10-CM

## 2021-07-22 DIAGNOSIS — B351 Tinea unguium: Secondary | ICD-10-CM | POA: Diagnosis not present

## 2021-07-22 DIAGNOSIS — Q6689 Other  specified congenital deformities of feet: Secondary | ICD-10-CM | POA: Diagnosis not present

## 2021-08-06 ENCOUNTER — Ambulatory Visit: Payer: PPO | Admitting: Internal Medicine

## 2021-08-06 ENCOUNTER — Encounter: Payer: Self-pay | Admitting: Internal Medicine

## 2021-08-06 ENCOUNTER — Other Ambulatory Visit: Payer: Self-pay

## 2021-08-06 VITALS — BP 153/78 | HR 74 | Temp 97.7°F | Resp 16 | Ht 66.0 in | Wt 132.8 lb

## 2021-08-06 DIAGNOSIS — A310 Pulmonary mycobacterial infection: Secondary | ICD-10-CM | POA: Diagnosis not present

## 2021-08-06 LAB — COMPLETE METABOLIC PANEL WITH GFR
AG Ratio: 1.8 (calc) (ref 1.0–2.5)
ALT: 10 U/L (ref 6–29)
AST: 15 U/L (ref 10–35)
Albumin: 4.2 g/dL (ref 3.6–5.1)
Alkaline phosphatase (APISO): 80 U/L (ref 37–153)
BUN/Creatinine Ratio: 22 (calc) (ref 6–22)
BUN: 11 mg/dL (ref 7–25)
CO2: 27 mmol/L (ref 20–32)
Calcium: 9.4 mg/dL (ref 8.6–10.4)
Chloride: 106 mmol/L (ref 98–110)
Creat: 0.49 mg/dL — ABNORMAL LOW (ref 0.60–1.00)
Globulin: 2.4 g/dL (calc) (ref 1.9–3.7)
Glucose, Bld: 84 mg/dL (ref 65–99)
Potassium: 4.2 mmol/L (ref 3.5–5.3)
Sodium: 142 mmol/L (ref 135–146)
Total Bilirubin: 1.1 mg/dL (ref 0.2–1.2)
Total Protein: 6.6 g/dL (ref 6.1–8.1)
eGFR: 98 mL/min/{1.73_m2} (ref >=60)

## 2021-08-06 LAB — CBC
HCT: 39.7 % (ref 35.0–45.0)
Hemoglobin: 13.4 g/dL (ref 11.7–15.5)
MCH: 31.8 pg (ref 27.0–33.0)
MCHC: 33.8 g/dL (ref 32.0–36.0)
MCV: 94.1 fL (ref 80.0–100.0)
MPV: 9.1 fL (ref 7.5–12.5)
Platelets: 225 10*3/uL (ref 140–400)
RBC: 4.22 10*6/uL (ref 3.80–5.10)
RDW: 12.8 % (ref 11.0–15.0)
WBC: 5.8 10*3/uL (ref 3.8–10.8)

## 2021-08-06 NOTE — Progress Notes (Signed)
[place] for Infectious Disease  CHIEF COMPLAINT:    Follow up for pulmonary MAC  SUBJECTIVE:    Tiffany Franklin is a 75 y.o. female with PMHx as below who presents to the clinic for pulmonary MAC.   She is here today for routine follow up visit today and was last seen on 05/06/21.  She continues on 3 drug therapy with azithromycin 512m TIW, ethambutol 257mkg TIW, and rifampin 60095mIW since 08/2020.  Sputum cultures in February 2022 were negative.  She had repeat AFB cultures done again in May 2022 that grew Mycobacterium immunogenum that was likely a contaminant.  Her repeat AFB cultures in August were negative at 6 weeks. She had repeat CT scan in May that was stable for no interval worsening.  Today reports pulmonary symptoms are stable from SOB standpoint and she is having more energy with doing regular Tai Chi and weights.  She is regularly doing her breathing treatments.  [place] and unfortunately caught COVID again despite being vaccinated x 3.  About 6 weeks ago she had some whitish "blisters" in her mouth that resolved with salt water washes after about 10 days.  Last weekend she had an episode of coughing up blood but this has spontaneously resolved.  She does report fatigue on the days she takes her 3 antibiotics.   Please see A&P for the details of today's visit and status of the patient's medical problems.   Patient's Medications  New Prescriptions   No medications on file  Previous Medications   ACETAMINOPHEN (TYLENOL) 500 MG TABLET    Take 500 mg by mouth every 6 (six) hours as needed (patient took 1/2 pill).   ALBUTEROL (PROVENTIL) (2.5 MG/3ML) 0.083% NEBULIZER SOLUTION    Take 3 mLs (2.5 mg total) by nebulization every 6 (six) hours as needed for wheezing or shortness of breath.   ALBUTEROL (VENTOLIN HFA) 108 (90 BASE) MCG/ACT INHALER    Inhale 2 puffs into the lungs every 6 (six) hours as needed for wheezing or shortness  of breath.   ALPRAZOLAM (XANAX) 0.25 MG TABLET    Take 0.25 mg by mouth daily as needed for anxiety.   ARTIFICIAL TEAR SOLUTION (TEARS NATURALE OP)    Place 1-2 drops into both eyes 3 (three) times daily as needed (dry/irritated eyes.).   AZITHROMYCIN (ZITHROMAX) 500 MG TABLET    TAKE 1 TABLET (500 MG TOTAL) BY MOUTH 3 (THREE) TIMES A WEEK.   CYANOCOBALAMIN (,VITAMIN B-12,) 1000 MCG/ML INJECTION    Inject 2,000 mcg into the muscle every 14 (fourteen) days.   ETHAMBUTOL (MYAMBUTOL) 400 MG TABLET    Take 4 tablets (1,600 mg total) by mouth 3 (three) times a week.   MULTIPLE VITAMIN (MULTIVITAMIN WITH MINERALS) TABS TABLET    Take 1 tablet by mouth daily. Women's 50+   RIFAMPIN (RIFADIN) 300 MG CAPSULE    Take 2 capsules (600 mg total) by mouth 3 (three) times a week.   VITAMIN D, ERGOCALCIFEROL, (DRISDOL) 1.25 MG (50000 UNIT) CAPS CAPSULE    Take 50,000 Units by mouth 2 (two) times a week.   ZOLPIDEM (AMBIEN) 5 MG TABLET    Take 2.5 mg by mouth at bedtime as needed for sleep.   Modified Medications   No medications on file  Discontinued Medications   No medications on file      Past Medical History:  Diagnosis Date   Allergy  Anxiety    Arthritis    Cataract of both eyes    Occasional tremors    hands bilat    Pneumonia    Wears glasses     Social History   Tobacco Use   Smoking status: Never   Smokeless tobacco: Never  Vaping Use   Vaping Use: Former  Substance Use Topics   Alcohol use: Yes    Comment: occ   Drug use: No    Family History  Problem Relation Age of Onset   Breast cancer Maternal Aunt    Breast cancer Paternal Aunt    Colon cancer Neg Hx    Stomach cancer Neg Hx    Esophageal cancer Neg Hx    Pancreatic cancer Neg Hx     Allergies  Allergen Reactions   Demerol [Meperidine] Nausea And Vomiting and Other (See Comments)    Blood pressure becomes elevated.    Review of Systems  All other systems reviewed and are negative. Except as noted in  HPI  OBJECTIVE:    Vitals:   08/06/21 0920  BP: (!) 153/78  Pulse: 74  Resp: 16  Temp: 97.7 F (36.5 C)  TempSrc: Oral  SpO2: 98%  Weight: 132 lb 12.8 oz (60.2 kg)  Height: _0  (1.676 m)   Body mass index is 21.43 kg/m.  Physical Exam Constitutional:      General: She is not in acute distress.    Appearance: Normal appearance.  HENT:     Head: Normocephalic and atraumatic.  Eyes:     Extraocular Movements: Extraocular movements intact.     Conjunctiva/sclera: Conjunctivae normal.  Pulmonary:     Effort: Pulmonary effort is normal. No respiratory distress.  Abdominal:     General: There is no distension.     Palpations: Abdomen is soft.  Neurological:     General: No focal deficit present.     Mental Status: She is alert and oriented to person, place, and time.  Psychiatric:        Mood and Affect: Mood normal.        Behavior: Behavior normal.     Labs and Microbiology: CBC Latest Ref Rng & Units 05/06/2021 02/03/2021 11/12/2020  WBC 3.8 - 10.8 Thousand/uL 4.6 6.6 8.6  Hemoglobin 11.7 - 15.5 g/dL 13.6 13.1 13.7  Hematocrit 35.0 - 45.0 % 40.5 39.2 40.8  Platelets 140 - 400 Thousand/uL 257 201 227   CMP Latest Ref Rng & Units 05/06/2021 02/03/2021 11/12/2020  Glucose 65 - 99 mg/dL 77 100(H) 88  BUN 7 - 25 mg/dL _1 Creatinine 0.60 - 1.00 mg/dL 0.60 0.71 0.84  Sodium 135 - 146 mmol/L 142 142 142  Potassium 3.5 - 5.3 mmol/L 4.9 4.5 4.6  Chloride 98 - 110 mmol/L 106 107 107  CO2 20 - 32 mmol/L _2 Calcium 8.6 - 10.4 mg/dL 9.5 9.6 9.8  Total Protein 6.1 - 8.1 g/dL 6.6 6.5 6.6  Total Bilirubin 0.2 - 1.2 mg/dL 0.5 1.6(H) 0.2  Alkaline Phos 39 - 117 U/L - - -  AST 10 - 35 U/L _3 ALT 6 - 29 U/L _4 ASSESSMENT & PLAN:    Pulmonary mycobacterial infection (HCC) She has clinical, radiologic, and microbiologic diagnosis of pulmonary MAC on 3 drug regimen of azithromycin, ethambutol, and rifampin TIW since November 2021 with noted microbiologic  response in February 2022.  She also has radiographic stability noted  in May 2022.  Most recent AFB cultures from August were negative and suspect the May 2022 culture was likely a colonization and non-pathogenic.  Will repeat safety labs today and continue with 3 drug regimen as described above.  EKG today with QTc 385.  Follow up in 3 months and plan for repeat CT scan around that time.  Advised return precautions if blood in her sputum recurs and also to regularly rinse her mouth after inhaler treatments.    Orders Placed This Encounter  Procedures   CBC   COMPLETE METABOLIC PANEL WITH GFR        [place] [facility] 08/06/2021, 10:14 AM  I spent 40 minutes dedicated to the care of this patient on the date of this encounter to include pre-visit review of records, face-to-face time with the patient discussing MAC infection, and post-visit ordering of testing.

## 2021-08-06 NOTE — Patient Instructions (Signed)
Thank you for coming to see me today. It was a pleasure seeing you.  To Do: Continue 3 antibiotics for MAC Continue inhalers Follow up in 3 months  If you have any questions or concerns, please do not hesitate to call the office at [phone].  Take Care,   Jule Ser

## 2021-08-06 NOTE — Assessment & Plan Note (Signed)
She has clinical, radiologic, and microbiologic diagnosis of pulmonary MAC on 3 drug regimen of azithromycin, ethambutol, and rifampin TIW since November 2021 with noted microbiologic response in February 2022.  She also has radiographic stability noted in May 2022.  Most recent AFB cultures from August were negative and suspect the May 2022 culture was likely a colonization and non-pathogenic.  Will repeat safety labs today and continue with 3 drug regimen as described above.  EKG today with QTc 385.  Follow up in 3 months and plan for repeat CT scan around that time.  Advised return precautions if blood in her sputum recurs and also to regularly rinse her mouth after inhaler treatments.

## 2021-08-07 ENCOUNTER — Telehealth: Payer: Self-pay

## 2021-08-07 NOTE — Telephone Encounter (Signed)
Attempted to reach patient to relay lab results. Left HIPPA compliance voicemail to return call. Eugenia Mcalpine

## 2021-08-07 NOTE — Telephone Encounter (Signed)
-----   Message from Mignon Pine, DO sent at 08/07/2021 12:53 PM EDT ----- Please let pt know that there is no issues with labs.  CMP and CBC both normal and she should continue 3 antibiotics for MAC infection.  Thanks

## 2021-08-13 ENCOUNTER — Encounter: Payer: Self-pay | Admitting: Internal Medicine

## 2021-08-13 ENCOUNTER — Non-Acute Institutional Stay: Payer: PPO | Admitting: Internal Medicine

## 2021-08-13 ENCOUNTER — Other Ambulatory Visit: Payer: Self-pay

## 2021-08-13 VITALS — BP 122/78 | HR 73 | Temp 97.3°F | Ht 66.0 in | Wt 131.4 lb

## 2021-08-13 DIAGNOSIS — J479 Bronchiectasis, uncomplicated: Secondary | ICD-10-CM

## 2021-08-13 DIAGNOSIS — A310 Pulmonary mycobacterial infection: Secondary | ICD-10-CM

## 2021-08-13 DIAGNOSIS — E538 Deficiency of other specified B group vitamins: Secondary | ICD-10-CM | POA: Diagnosis not present

## 2021-08-13 DIAGNOSIS — F419 Anxiety disorder, unspecified: Secondary | ICD-10-CM | POA: Diagnosis not present

## 2021-08-13 DIAGNOSIS — F5101 Primary insomnia: Secondary | ICD-10-CM | POA: Diagnosis not present

## 2021-08-14 NOTE — Progress Notes (Signed)
Location:  [place] of Service:  Clinic (12)  Provider:   Code Status:  Goals of Care:  Advanced Directives 07/18/2020  Does Patient Have a Medical Advance Directive? Yes  Type of Advance Directive [place]  Does patient want to make changes to medical advance directive? -  Copy of [place] in Chart? Yes - validated most recent copy scanned in chart (See row information)  Pre-existing out of facility DNR order (yellow form or pink MOST form) -     Chief Complaint  Patient presents with   Medical Management of Chronic Issues    Patient here today to establish care.     HPI: Patient is a 75 y.o. female seen today for medical management of chronic diseases.    Patient has h/o pulmonary Mycobacterium here to establish care Diagnosed in 08/24/2020.  She has a history of recurrent pneumonias.  Underwent bronchoscopy by Dr. Vaughan Browner.  BAL cultures were positive for MAC Patient has been on treatment with 3 drug therapy with azithromycin ethambutol and rifampin since then.  Follows with infectious disease By Dr. Juleen China note her last cultures done in August were negative.  The CT scan has been stable.  He does plan to repeat the CT in another 3 months and discussed to continue therapy Patient states that she feels better denies any cough or shortness of breath.  Is able to tolerate the medications though still gets diarrhea and nausea the days she takes it. Patient also has a history of anxiety takes Xanax as needed and insomnia takes Ambien as needed Has Refused Covid Booster, Flu shot and Shingrix No Recent DEXA Follows with Dr Virgina Jock  Recent Move to [place] after her Marriage Also sees PCP in [place]. He has her on Nebs, B12 and Vit D which she thinks has really helped her  Past Medical History:  Diagnosis Date   Allergy    Anxiety    Arthritis    Cataract of both eyes    Occasional tremors    hands  bilat    Pneumonia    Wears glasses     Past Surgical History:  Procedure Laterality Date   ABDOMINAL HYSTERECTOMY     APPENDECTOMY     BRONCHIAL WASHINGS  07/18/2020   Procedure: BRONCHIAL WASHINGS;  Surgeon: Marshell Garfinkel, MD;  Location: [facility];  Service: Cardiopulmonary;;   DILATION AND CURETTAGE OF UTERUS     SOFT TISSUE MASS EXCISION Left    TUBAL LIGATION     VIDEO BRONCHOSCOPY  07/18/2020   Procedure: VIDEO BRONCHOSCOPY WITHOUT FLUORO;  Surgeon: Marshell Garfinkel, MD;  Location: [facility];  Service: Cardiopulmonary;;    Allergies  Allergen Reactions   Demerol [Meperidine] Nausea And Vomiting and Other (See Comments)    Blood pressure becomes elevated.    Outpatient Encounter Medications as of 08/13/2021  Medication Sig   acetaminophen (TYLENOL) 500 MG tablet Take 500 mg by mouth every 6 (six) hours as needed (patient took 1/2 pill).   albuterol (PROVENTIL) (2.5 MG/3ML) 0.083% nebulizer solution Take 3 mLs (2.5 mg total) by nebulization every 6 (six) hours as needed for wheezing or shortness of breath.   albuterol (VENTOLIN HFA) 108 (90 Base) MCG/ACT inhaler Inhale 2 puffs into the lungs every 6 (six) hours as needed for wheezing or shortness of breath.   ALPRAZolam (XANAX) 0.25 MG tablet Take 0.25 mg by mouth daily as needed for anxiety.   Artificial Tear Solution (  TEARS NATURALE OP) Place 1-2 drops into both eyes 3 (three) times daily as needed (dry/irritated eyes.).   azithromycin (ZITHROMAX) 500 MG tablet TAKE 1 TABLET (500 MG TOTAL) BY MOUTH 3 (THREE) TIMES A WEEK.   cyanocobalamin (,VITAMIN B-12,) 1000 MCG/ML injection Inject 2,000 mcg into the muscle every 14 (fourteen) days.   ethambutol (MYAMBUTOL) 400 MG tablet Take 4 tablets (1,600 mg total) by mouth 3 (three) times a week.   Multiple Vitamin (MULTIVITAMIN WITH MINERALS) TABS tablet Take 1 tablet by mouth daily. Women's 50+   rifampin (RIFADIN) 300 MG capsule Take 2 capsules (600 mg total) by mouth 3  (three) times a week.   Vitamin D, Ergocalciferol, (DRISDOL) 1.25 MG (50000 UNIT) CAPS capsule Take 50,000 Units by mouth 2 (two) times a week.   zolpidem (AMBIEN) 5 MG tablet Take 2.5 mg by mouth at bedtime as needed for sleep.    Facility-Administered Encounter Medications as of 08/13/2021  Medication   0.9 %  sodium chloride infusion   lactated ringers infusion    Review of Systems:  Review of Systems Review of Systems  Constitutional: Negative for activity change, appetite change, chills, diaphoresis, fatigue and fever.  HENT: Negative for mouth sores, postnasal drip, rhinorrhea, sinus pain and sore throat.   Respiratory: Negative for apnea, cough, chest tightness, shortness of breath and wheezing.   Cardiovascular: Negative for chest pain, palpitations and leg swelling.  Gastrointestinal: Negative for abdominal distention, abdominal pain, constipation, diarrhea, nausea and vomiting.  Genitourinary: Negative for dysuria and frequency.  Musculoskeletal: Negative for arthralgias, joint swelling and myalgias.  Skin: Negative for rash.  Neurological: Negative for dizziness, syncope, weakness, light-headedness and numbness.  Psychiatric/Behavioral: Negative for behavioral problems, confusion and sleep disturbance.    Health Maintenance  Topic Date Due   Pneumonia Vaccine 70+ Years old (1 - PCV) Never done   Hepatitis C Screening  Never done   TETANUS/TDAP  Never done   Zoster Vaccines- Shingrix (1 of 2) Never done   DEXA SCAN  Never done   COVID-19 Vaccine (3 - Moderna risk series) 01/07/2020   INFLUENZA VACCINE  05/05/2021   HPV VACCINES  Aged Out    Physical Exam: Vitals:   08/13/21 1032  BP: 122/78  Pulse: 73  Temp: (!) 97.3 F (36.3 C)  SpO2: 100%  Weight: 131 lb 6.4 oz (59.6 kg)  Height: _0  (1.676 m)   Body mass index is 21.21 kg/m. Physical Exam Constitutional: Oriented to person, place, and time. Well-developed and well-nourished.  HENT:  Head:  Normocephalic.  Mouth/Throat: Oropharynx is clear and moist.  Eyes: Pupils are equal, round, and reactive to light.  Neck: Neck supple.  Cardiovascular: Normal rate and normal heart sounds.  No murmur heard. Pulmonary/Chest: Effort normal and breath sounds normal. No respiratory distress. No wheezes. She has no rales.  Abdominal: Soft. Bowel sounds are normal. No distension. There is no tenderness. There is no rebound.  Musculoskeletal: No edema.  Lymphadenopathy: none Neurological: Alert and oriented to person, place, and time.  Skin: Skin is warm and dry.  Psychiatric: Normal mood and affect. Behavior is normal. Thought content normal.   Labs reviewed: Basic Metabolic Panel: Recent Labs    02/03/21 1404 05/06/21 1007 08/06/21 1018  NA 142 142 142  K 4.5 4.9 4.2  CL 107 106 106  CO2 _1 GLUCOSE 100* 77 84  BUN _2 CREATININE 0.71 0.60 0.49*  CALCIUM 9.6 9.5 9.4   Liver Function  Tests: Recent Labs    02/03/21 1404 05/06/21 1007 08/06/21 1018  AST _0 ALT _1 BILITOT 1.6* 0.5 1.1  PROT 6.5 6.6 6.6   No results for input(s): LIPASE, AMYLASE in the last 8760 hours. No results for input(s): AMMONIA in the last 8760 hours. CBC: Recent Labs    11/12/20 1422 02/03/21 1404 05/06/21 1007 08/06/21 1018  WBC 8.6 6.6 4.6 5.8  NEUTROABS 6,622  --   --   --   HGB 13.7 13.1 13.6 13.4  HCT 40.8 39.2 40.5 39.7  MCV 94.4 95.1 93.8 94.1  PLT 227 201 257 225   Lipid Panel: No results for input(s): CHOL, HDL, LDLCALC, TRIG, CHOLHDL, LDLDIRECT in the last 8760 hours. No results found for: HGBA1C  Procedures since last visit: No results found.  Assessment/Plan 1. Pulmonary mycobacterial infection ([place]) On 3 drug Therapy Follows with Dr Juleen China Doe shave some GI side effects but tolerating well  2. Bronchiectasis without complication ([place]) On Nebs PRN  3. Anxiety Takes Xanax Prn Per Dr Virgina Jock  4. Primary insomnia Ambien PRN   5. B12  deficiency On Injections per her PCP   Patient is going to follow with Dr Virgina Jock right now Will call for her Follow up Appointment    Labs/tests ordered:  * No order type specified * Next appt:  Visit date not found

## 2021-09-11 DIAGNOSIS — H524 Presbyopia: Secondary | ICD-10-CM | POA: Diagnosis not present

## 2021-09-11 DIAGNOSIS — H04123 Dry eye syndrome of bilateral lacrimal glands: Secondary | ICD-10-CM | POA: Diagnosis not present

## 2021-09-11 DIAGNOSIS — H2513 Age-related nuclear cataract, bilateral: Secondary | ICD-10-CM | POA: Diagnosis not present

## 2021-09-16 DIAGNOSIS — E7800 Pure hypercholesterolemia, unspecified: Secondary | ICD-10-CM | POA: Diagnosis not present

## 2021-09-16 DIAGNOSIS — Z79899 Other long term (current) drug therapy: Secondary | ICD-10-CM | POA: Diagnosis not present

## 2021-09-16 DIAGNOSIS — R7989 Other specified abnormal findings of blood chemistry: Secondary | ICD-10-CM | POA: Diagnosis not present

## 2021-09-23 DIAGNOSIS — A319 Mycobacterial infection, unspecified: Secondary | ICD-10-CM | POA: Diagnosis not present

## 2021-09-23 DIAGNOSIS — R748 Abnormal levels of other serum enzymes: Secondary | ICD-10-CM | POA: Diagnosis not present

## 2021-09-23 DIAGNOSIS — F419 Anxiety disorder, unspecified: Secondary | ICD-10-CM | POA: Diagnosis not present

## 2021-09-23 DIAGNOSIS — R140 Abdominal distension (gaseous): Secondary | ICD-10-CM | POA: Diagnosis not present

## 2021-09-23 DIAGNOSIS — Z0000 Encounter for general adult medical examination without abnormal findings: Secondary | ICD-10-CM | POA: Diagnosis not present

## 2021-09-23 DIAGNOSIS — Z1331 Encounter for screening for depression: Secondary | ICD-10-CM | POA: Diagnosis not present

## 2021-09-23 DIAGNOSIS — Z1339 Encounter for screening examination for other mental health and behavioral disorders: Secondary | ICD-10-CM | POA: Diagnosis not present

## 2021-09-23 DIAGNOSIS — J479 Bronchiectasis, uncomplicated: Secondary | ICD-10-CM | POA: Diagnosis not present

## 2021-09-23 DIAGNOSIS — L600 Ingrowing nail: Secondary | ICD-10-CM | POA: Diagnosis not present

## 2021-09-23 DIAGNOSIS — G4700 Insomnia, unspecified: Secondary | ICD-10-CM | POA: Diagnosis not present

## 2021-09-23 DIAGNOSIS — M1990 Unspecified osteoarthritis, unspecified site: Secondary | ICD-10-CM | POA: Diagnosis not present

## 2021-09-23 DIAGNOSIS — E7800 Pure hypercholesterolemia, unspecified: Secondary | ICD-10-CM | POA: Diagnosis not present

## 2021-10-31 IMAGING — MG MM DIGITAL SCREENING BILAT W/ TOMO AND CAD
6 of 10 series · 6 of 30 positions shown · non-contrast
Comparison: Previous exam(s).

CLINICAL DATA: Screening.

EXAM:
DIGITAL SCREENING BILATERAL MAMMOGRAM WITH TOMOSYNTHESIS AND CAD
TECHNIQUE: Bilateral screening digital craniocaudal and mediolateral oblique
mammograms were obtained. Bilateral screening digital breast
tomosynthesis was performed. The images were evaluated with
computer-aided detection.

[L MLO synth-2D (1 of 2)]
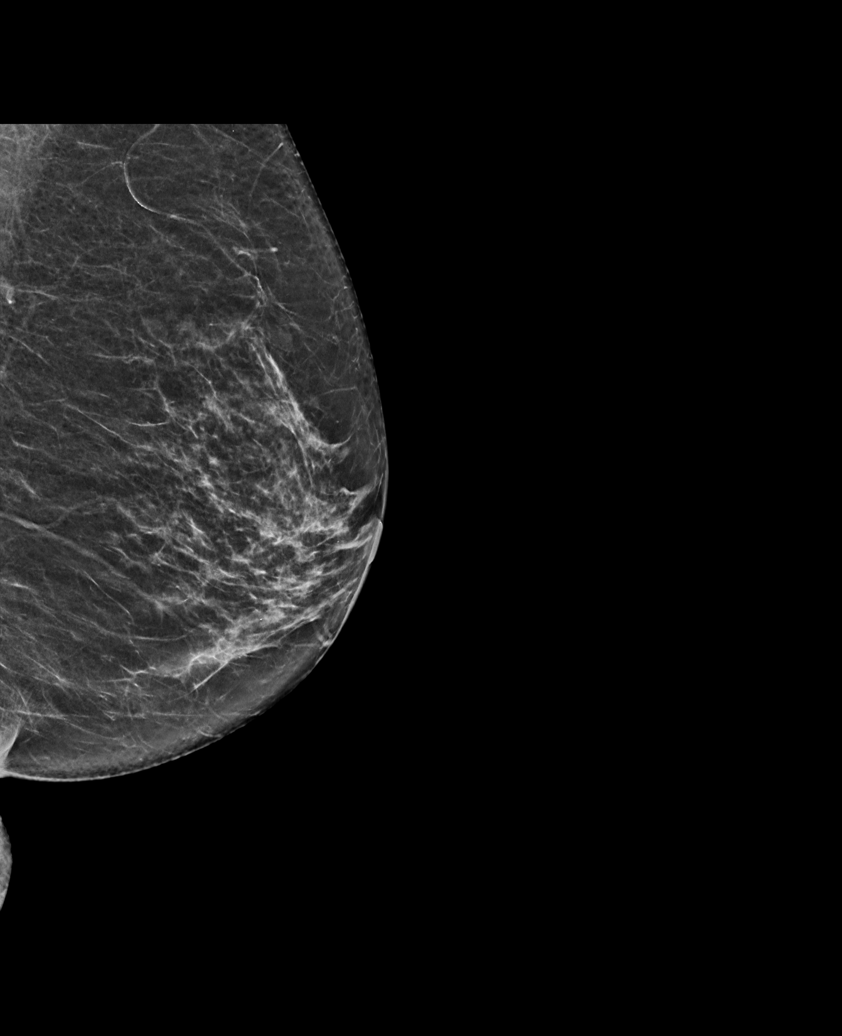

[R MLO synth-2D]
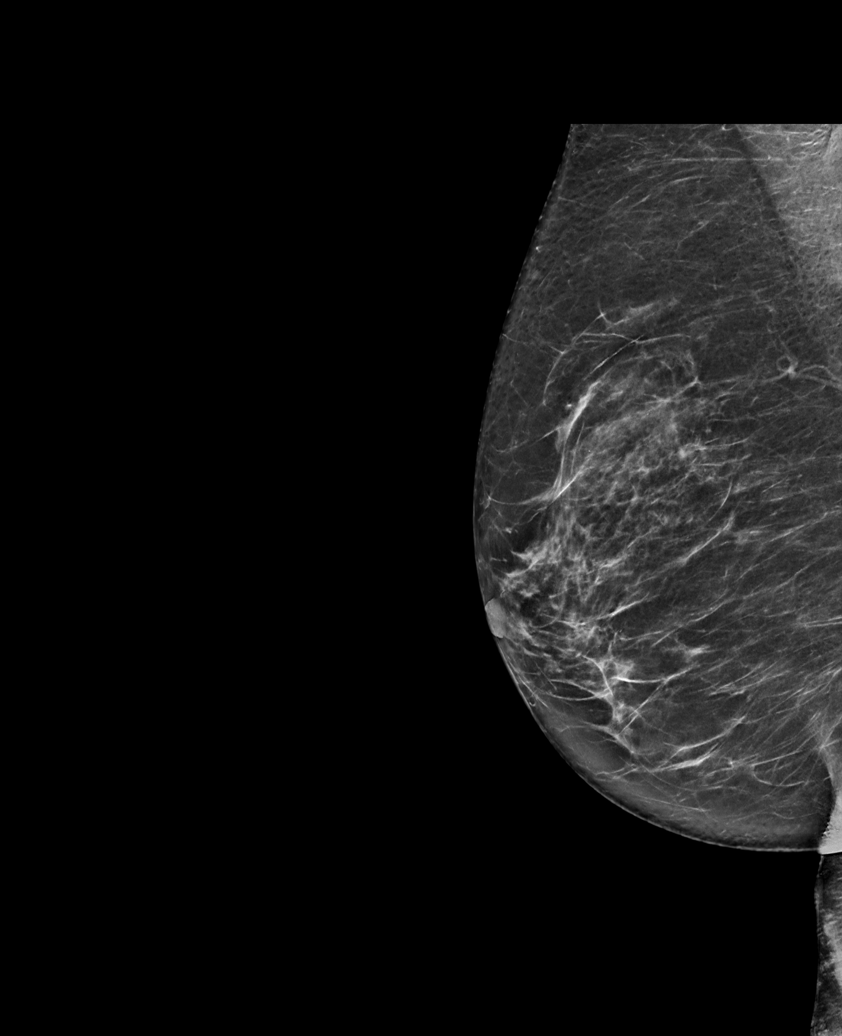

[L MLO synth-2D (2 of 2)]
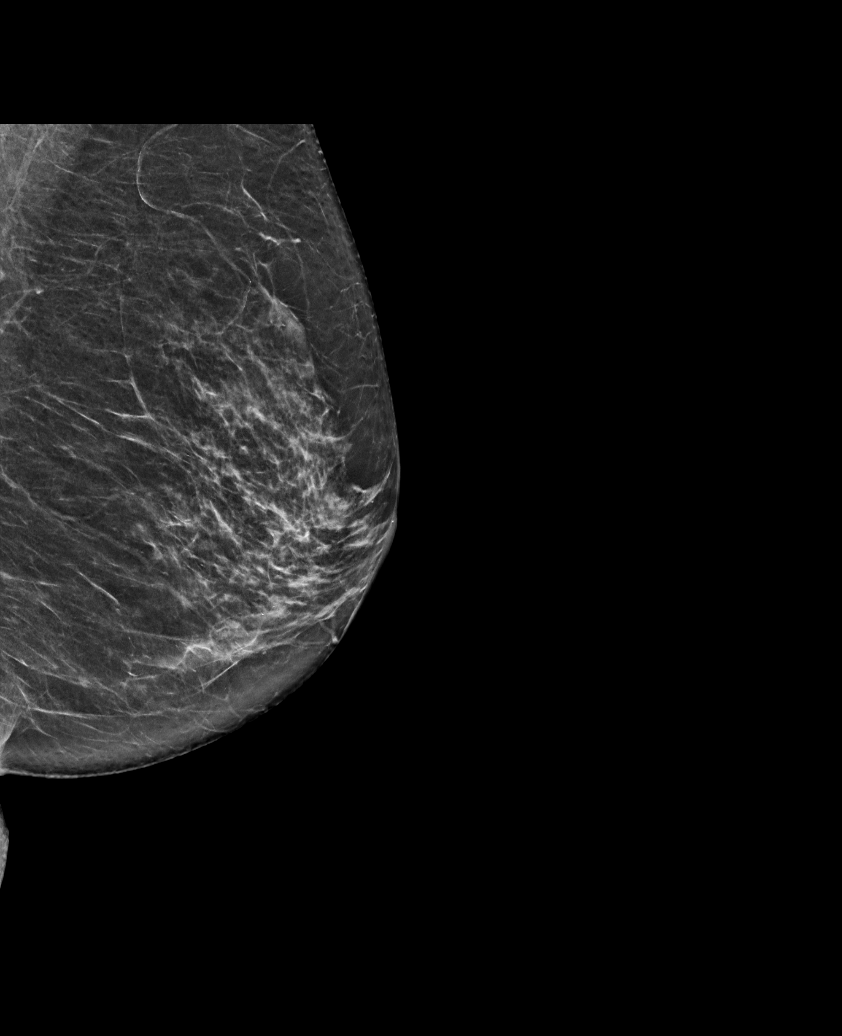

[R CC synth-2D]
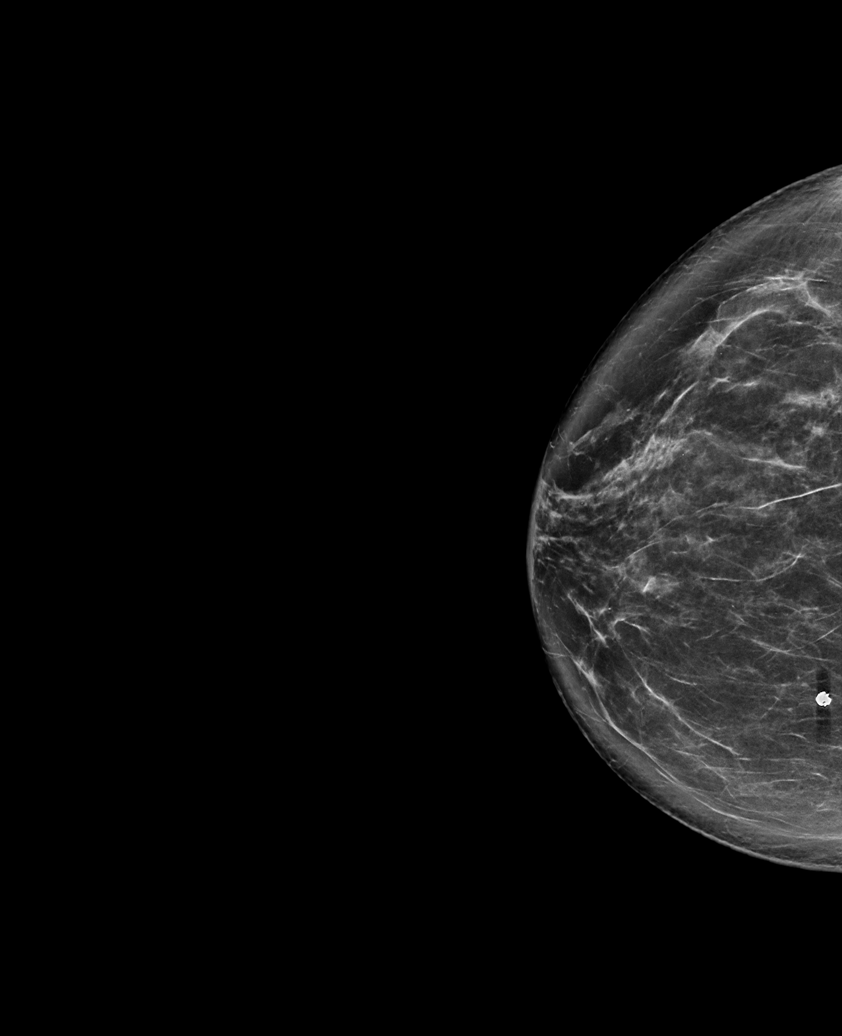

[L CC synth-2D]
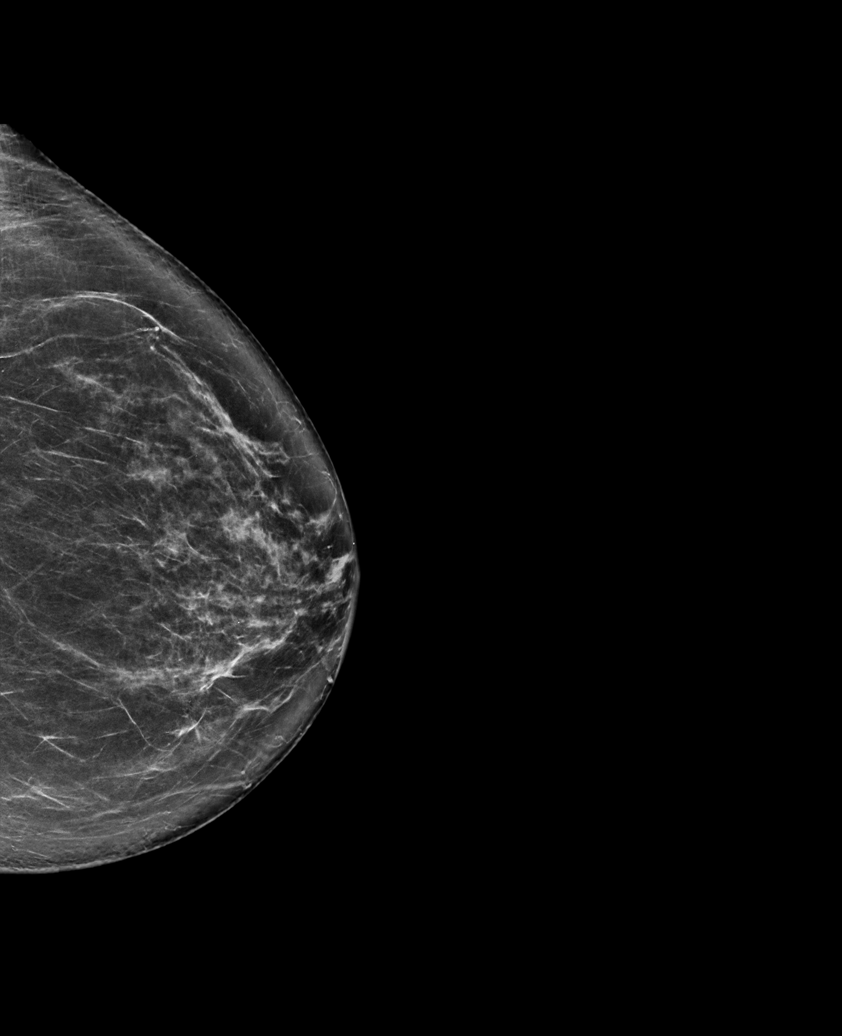

[L CC tomo · tomo slice 41/81.0]
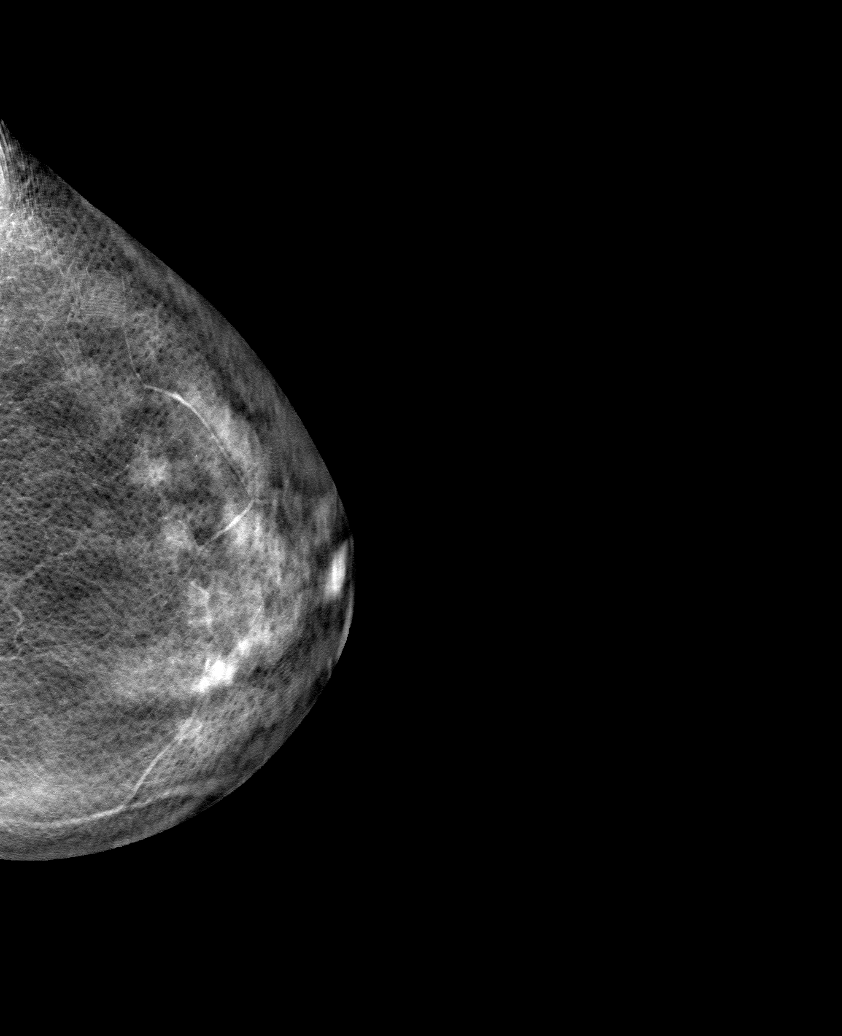

[6 of 30 positions shown; findings below may reference images not displayed]

ACR Breast Density Category c: The breast tissue is heterogeneously
dense, which may obscure small masses.
FINDINGS: There are no findings suspicious for malignancy. The images were
evaluated with computer-aided detection.
IMPRESSION: No mammographic evidence of malignancy. A result letter of this
screening mammogram will be mailed directly to the patient.

RECOMMENDATION:
Screening mammogram in one year. (Code:T4-5-GWO)

BI-RADS CATEGORY  1: Negative.

## 2021-11-06 ENCOUNTER — Other Ambulatory Visit: Payer: Self-pay

## 2021-11-06 ENCOUNTER — Encounter: Payer: Self-pay | Admitting: Internal Medicine

## 2021-11-06 ENCOUNTER — Ambulatory Visit: Payer: PPO | Admitting: Internal Medicine

## 2021-11-06 VITALS — BP 117/73 | HR 69 | Temp 97.5°F | Resp 16 | Ht 66.0 in | Wt 133.0 lb

## 2021-11-06 DIAGNOSIS — A310 Pulmonary mycobacterial infection: Secondary | ICD-10-CM

## 2021-11-06 NOTE — Assessment & Plan Note (Signed)
Patient was diagnosed with pulmonary MAC infection after clinical, radiographic, and microbiologic criteria were met.  She has been on 3-drug regimen of azithromycin, ethambutol, and rifampin since November 2021 and noted to have culture clearance in February 2022.  Will have her discontinue therapy at this point and monitor off antibiotics since she has completed 12 months of therapy after culture clearance.  Will repeat CT scan and sputum cultures at this point and follow up in 6 months.  She was also advised to continue with airway clearance techniques and any inhaler/nebulizers as recommended by pulmonary.

## 2021-11-06 NOTE — Patient Instructions (Signed)
Thank you for coming to see me today. It was a pleasure seeing you.  To Do: Stop antibiotics for your MAC infection Get CT scan and submit sputum cultures for follow up Continue nebulizers, airway clearance as recommended by lung doctors Follow up with me in 6 months  If you have any questions or concerns, please do not hesitate to call the office at [phone].  Take Care,   Tiffany Franklin

## 2021-11-06 NOTE — Progress Notes (Signed)
[place] for Infectious Disease  CHIEF COMPLAINT:    Follow up for pulmonary MAC infection  Chief Complaint  Patient presents with   Follow-up    Pulmonary mycobacterial infection     SUBJECTIVE:    Tiffany Franklin is a 76 y.o. female with PMHx as below who presents to the clinic for pulmonary MAC infection.   She is here today for routine follow up.  She was last seen on 08/06/21.  She continues on 3 drug therapy with azithromycin 565m TIW, ethambutol 212mkg TIW, and rifampin 60084mIW since 08/2020.  Sputum cultures in February 2022 were negative.  She had repeat AFB cultures done again in May 2022 that grew Mycobacterium immunogenum that was likely a contaminant.  Her repeat AFB cultures in August were negative .  Her last CT chest was in May 2022 and was stable without worsening.  Today, she reports stability of her pulmonary symptoms with some continued cough and occasional green sputum.  Using her inhaler helps with this.  She continues to report GI side effects on the days that she takes her antibiotics but this has been manageable.   She also states she has mucus like stool.  Overall, she states she is doing okay for the most part.   Please see A&P for the details of today's visit and status of the patient's medical problems.   Patient's Medications  New Prescriptions   No medications on file  Previous Medications   ACETAMINOPHEN (TYLENOL) 500 MG TABLET    Take 500 mg by mouth every 6 (six) hours as needed (patient took 1/2 pill).   ALBUTEROL (PROVENTIL) (2.5 MG/3ML) 0.083% NEBULIZER SOLUTION    Take 3 mLs (2.5 mg total) by nebulization every 6 (six) hours as needed for wheezing or shortness of breath.   ALBUTEROL (VENTOLIN HFA) 108 (90 BASE) MCG/ACT INHALER    Inhale 2 puffs into the lungs every 6 (six) hours as needed for wheezing or shortness of breath.   ALPRAZOLAM (XANAX) 0.25 MG TABLET    Take 0.25 mg by mouth daily as needed for anxiety.    ARTIFICIAL TEAR SOLUTION (TEARS NATURALE OP)    Place 1-2 drops into both eyes 3 (three) times daily as needed (dry/irritated eyes.).   CYANOCOBALAMIN (,VITAMIN B-12,) 1000 MCG/ML INJECTION    Inject 2,000 mcg into the muscle every 14 (fourteen) days.   MULTIPLE VITAMIN (MULTIVITAMIN WITH MINERALS) TABS TABLET    Take 1 tablet by mouth daily. Women's 50+   VITAMIN D, ERGOCALCIFEROL, (DRISDOL) 1.25 MG (50000 UNIT) CAPS CAPSULE    Take 50,000 Units by mouth 2 (two) times a week.   ZOLPIDEM (AMBIEN) 5 MG TABLET    Take 2.5 mg by mouth at bedtime as needed for sleep.   Modified Medications   No medications on file  Discontinued Medications   AZITHROMYCIN (ZITHROMAX) 500 MG TABLET    TAKE 1 TABLET (500 MG TOTAL) BY MOUTH 3 (THREE) TIMES A WEEK.   ETHAMBUTOL (MYAMBUTOL) 400 MG TABLET    2 tabs TIW as directed per ID   RIFAMPIN (RIFADIN) 150 MG CAPSULE    4 capsules TIW as directed      Past Medical History:  Diagnosis Date   Allergy    Anxiety    Arthritis    Cataract of both eyes    Occasional tremors    hands bilat    Pneumonia    Wears glasses     Social  History   Tobacco Use   Smoking status: Never   Smokeless tobacco: Never  Vaping Use   Vaping Use: Former  Substance Use Topics   Alcohol use: Yes    Comment: occ   Drug use: No    Family History  Problem Relation Age of Onset   Breast cancer Maternal Aunt    Breast cancer Paternal Aunt    Colon cancer Neg Hx    Stomach cancer Neg Hx    Esophageal cancer Neg Hx    Pancreatic cancer Neg Hx     Allergies  Allergen Reactions   Demerol [Meperidine] Nausea And Vomiting and Other (See Comments)    Blood pressure becomes elevated.    Review of Systems  All other systems reviewed and are negative. Except as noted above.    OBJECTIVE:    Vitals:   11/06/21 1339  BP: 117/73  Pulse: 69  Resp: 16  Temp: (!) 97.5 F (36.4 C)  TempSrc: Oral  SpO2: 100%  Weight: 133 lb (60.3 kg)  Height: _0  (1.676 m)   Body  mass index is 21.47 kg/m.  Physical Exam Constitutional:      General: She is not in acute distress.    Appearance: Normal appearance.  HENT:     Head: Normocephalic and atraumatic.  Eyes:     Extraocular Movements: Extraocular movements intact.     Conjunctiva/sclera: Conjunctivae normal.  Pulmonary:     Effort: Pulmonary effort is normal. No respiratory distress.  Musculoskeletal:        General: Normal range of motion.  Skin:    General: Skin is warm and dry.  Neurological:     General: No focal deficit present.     Mental Status: She is alert and oriented to person, place, and time.  Psychiatric:        Mood and Affect: Mood normal.        Behavior: Behavior normal.     Labs and Microbiology: CBC Latest Ref Rng & Units 08/06/2021 05/06/2021 02/03/2021  WBC 3.8 - 10.8 Thousand/uL 5.8 4.6 6.6  Hemoglobin 11.7 - 15.5 g/dL 13.4 13.6 13.1  Hematocrit 35.0 - 45.0 % 39.7 40.5 39.2  Platelets 140 - 400 Thousand/uL 225 257 201   CMP Latest Ref Rng & Units 08/06/2021 05/06/2021 02/03/2021  Glucose 65 - 99 mg/dL 84 77 100(H)  BUN 7 - 25 mg/dL _1 Creatinine 0.60 - 1.00 mg/dL 0.49(L) 0.60 0.71  Sodium 135 - 146 mmol/L 142 142 142  Potassium 3.5 - 5.3 mmol/L 4.2 4.9 4.5  Chloride 98 - 110 mmol/L 106 106 107  CO2 20 - 32 mmol/L _2 Calcium 8.6 - 10.4 mg/dL 9.4 9.5 9.6  Total Protein 6.1 - 8.1 g/dL 6.6 6.6 6.5  Total Bilirubin 0.2 - 1.2 mg/dL 1.1 0.5 1.6(H)  Alkaline Phos 39 - 117 U/L - - -  AST 10 - 35 U/L _3 ALT 6 - 29 U/L _4 ASSESSMENT & PLAN:    Pulmonary mycobacterial infection ([facility]) Patient was diagnosed with pulmonary MAC infection after clinical, radiographic, and microbiologic criteria were met.  She has been on 3-drug regimen of azithromycin, ethambutol, and rifampin since November 2021 and noted to have culture clearance in February 2022.  Will have her discontinue therapy at this point and monitor off antibiotics since she has completed 12  months of therapy after culture clearance.  Will repeat CT scan  and sputum cultures at this point and follow up in 6 months.  She was also advised to continue with airway clearance techniques and any inhaler/nebulizers as recommended by pulmonary.    Orders Placed This Encounter  Procedures   MYCOBACTERIA, CULTURE, WITH FLUOROCHROME SMEAR   CT CHEST WO CONTRAST    Standing Status:   Future    Standing Expiration Date:   11/06/2022    Order Specific Question:   Preferred imaging location?    Answer:   [facility] 11/06/2021, 2:02 PM  I spent 30 minutes dedicated to the care of this patient on the date of this encounter to include pre-visit review of records, face-to-face time with the patient discussing pulmonary MAC, and post-visit ordering of testing.

## 2021-11-07 ENCOUNTER — Other Ambulatory Visit: Payer: PPO

## 2021-11-07 ENCOUNTER — Other Ambulatory Visit: Payer: Self-pay

## 2021-11-07 DIAGNOSIS — A310 Pulmonary mycobacterial infection: Secondary | ICD-10-CM

## 2021-11-07 NOTE — Addendum Note (Signed)
Addended by: Caffie Pinto on: 11/07/2021 11:31 AM   Modules accepted: Orders

## 2021-11-11 ENCOUNTER — Ambulatory Visit: Payer: PPO | Admitting: Internal Medicine

## 2021-11-12 ENCOUNTER — Other Ambulatory Visit: Payer: Self-pay

## 2021-11-12 ENCOUNTER — Ambulatory Visit (HOSPITAL_COMMUNITY)
Admission: RE | Admit: 2021-11-12 | Discharge: 2021-11-12 | Disposition: A | Discharge status: Home / Self Care | Payer: PPO | Source: Ambulatory Visit | Attending: Internal Medicine | Admitting: Internal Medicine

## 2021-11-12 ENCOUNTER — Encounter (HOSPITAL_COMMUNITY): Payer: Self-pay

## 2021-11-12 DIAGNOSIS — A310 Pulmonary mycobacterial infection: Secondary | ICD-10-CM | POA: Diagnosis not present

## 2021-11-12 DIAGNOSIS — R911 Solitary pulmonary nodule: Secondary | ICD-10-CM | POA: Diagnosis not present

## 2021-11-12 DIAGNOSIS — R079 Chest pain, unspecified: Secondary | ICD-10-CM | POA: Diagnosis not present

## 2021-11-12 DIAGNOSIS — I700 Atherosclerosis of aorta: Secondary | ICD-10-CM | POA: Diagnosis not present

## 2021-11-12 DIAGNOSIS — J479 Bronchiectasis, uncomplicated: Secondary | ICD-10-CM | POA: Diagnosis not present

## 2021-11-14 ENCOUNTER — Telehealth: Payer: Self-pay

## 2021-11-14 ENCOUNTER — Other Ambulatory Visit (HOSPITAL_COMMUNITY): Payer: PPO

## 2021-11-14 NOTE — Telephone Encounter (Signed)
Patient requesting CT scan results.    [place], CMA

## 2021-11-18 NOTE — Progress Notes (Signed)
Message sent to patient via mychart

## 2021-11-24 DIAGNOSIS — A319 Mycobacterial infection, unspecified: Secondary | ICD-10-CM | POA: Diagnosis not present

## 2021-11-24 DIAGNOSIS — E785 Hyperlipidemia, unspecified: Secondary | ICD-10-CM | POA: Diagnosis not present

## 2021-11-24 DIAGNOSIS — J42 Unspecified chronic bronchitis: Secondary | ICD-10-CM | POA: Diagnosis not present

## 2021-11-24 DIAGNOSIS — Z6821 Body mass index (BMI) 21.0-21.9, adult: Secondary | ICD-10-CM | POA: Diagnosis not present

## 2021-11-24 DIAGNOSIS — I700 Atherosclerosis of aorta: Secondary | ICD-10-CM | POA: Diagnosis not present

## 2021-12-16 DIAGNOSIS — H2513 Age-related nuclear cataract, bilateral: Secondary | ICD-10-CM | POA: Diagnosis not present

## 2021-12-16 DIAGNOSIS — H524 Presbyopia: Secondary | ICD-10-CM | POA: Diagnosis not present

## 2021-12-18 ENCOUNTER — Other Ambulatory Visit: Payer: Self-pay | Admitting: Internal Medicine

## 2021-12-18 DIAGNOSIS — L821 Other seborrheic keratosis: Secondary | ICD-10-CM | POA: Diagnosis not present

## 2021-12-18 DIAGNOSIS — Z85828 Personal history of other malignant neoplasm of skin: Secondary | ICD-10-CM | POA: Diagnosis not present

## 2021-12-18 DIAGNOSIS — C44311 Basal cell carcinoma of skin of nose: Secondary | ICD-10-CM | POA: Diagnosis not present

## 2021-12-24 DIAGNOSIS — Z01419 Encounter for gynecological examination (general) (routine) without abnormal findings: Secondary | ICD-10-CM | POA: Diagnosis not present

## 2021-12-24 LAB — MYCOBACTERIA,CULT W/FLUOROCHROME SMEAR
MICRO NUMBER:: 12962628
SMEAR:: NONE SEEN
SPECIMEN QUALITY:: ADEQUATE

## 2021-12-30 ENCOUNTER — Telehealth: Payer: Self-pay

## 2021-12-30 NOTE — Telephone Encounter (Signed)
Spoke with patient, notified her that sputum cultures were finalized as negative and scheduled her for her 6 month follow up.  ? ?Beryle Flock, RN ? ?

## 2021-12-30 NOTE — Telephone Encounter (Signed)
-----   Message from Mignon Pine, DO sent at 12/30/2021  1:23 PM EDT ----- ?Please let patient know that her AFB sputum cultures after 6 weeks were finalized as negative. ? ?Thanks ?

## 2022-01-01 ENCOUNTER — Other Ambulatory Visit: Payer: Self-pay | Admitting: Internal Medicine

## 2022-01-01 DIAGNOSIS — A310 Pulmonary mycobacterial infection: Secondary | ICD-10-CM

## 2022-01-12 DIAGNOSIS — A319 Mycobacterial infection, unspecified: Secondary | ICD-10-CM | POA: Diagnosis not present

## 2022-01-12 DIAGNOSIS — J309 Allergic rhinitis, unspecified: Secondary | ICD-10-CM | POA: Diagnosis not present

## 2022-01-12 DIAGNOSIS — R5383 Other fatigue: Secondary | ICD-10-CM | POA: Diagnosis not present

## 2022-01-12 DIAGNOSIS — R059 Cough, unspecified: Secondary | ICD-10-CM | POA: Diagnosis not present

## 2022-01-12 DIAGNOSIS — Z1152 Encounter for screening for COVID-19: Secondary | ICD-10-CM | POA: Diagnosis not present

## 2022-01-12 DIAGNOSIS — J329 Chronic sinusitis, unspecified: Secondary | ICD-10-CM | POA: Diagnosis not present

## 2022-01-19 ENCOUNTER — Ambulatory Visit
Admission: RE | Admit: 2022-01-19 | Discharge: 2022-01-19 | Disposition: A | Discharge status: Home / Self Care | Payer: PPO | Source: Ambulatory Visit | Attending: Internal Medicine | Admitting: Internal Medicine

## 2022-01-19 DIAGNOSIS — H903 Sensorineural hearing loss, bilateral: Secondary | ICD-10-CM | POA: Diagnosis not present

## 2022-01-19 DIAGNOSIS — Z1231 Encounter for screening mammogram for malignant neoplasm of breast: Secondary | ICD-10-CM

## 2022-01-21 DIAGNOSIS — H2512 Age-related nuclear cataract, left eye: Secondary | ICD-10-CM | POA: Diagnosis not present

## 2022-01-21 DIAGNOSIS — H25812 Combined forms of age-related cataract, left eye: Secondary | ICD-10-CM | POA: Diagnosis not present

## 2022-01-26 DIAGNOSIS — C44311 Basal cell carcinoma of skin of nose: Secondary | ICD-10-CM | POA: Diagnosis not present

## 2022-01-26 DIAGNOSIS — Z85828 Personal history of other malignant neoplasm of skin: Secondary | ICD-10-CM | POA: Diagnosis not present

## 2022-02-10 ENCOUNTER — Ambulatory Visit: Payer: PPO

## 2022-02-11 DIAGNOSIS — H2511 Age-related nuclear cataract, right eye: Secondary | ICD-10-CM | POA: Diagnosis not present

## 2022-02-11 DIAGNOSIS — H25811 Combined forms of age-related cataract, right eye: Secondary | ICD-10-CM | POA: Diagnosis not present

## 2022-02-20 ENCOUNTER — Ambulatory Visit
Admission: RE | Admit: 2022-02-20 | Discharge: 2022-02-20 | Disposition: A | Discharge status: Home / Self Care | Payer: PPO | Source: Ambulatory Visit | Attending: Internal Medicine | Admitting: Internal Medicine

## 2022-02-20 DIAGNOSIS — Z1231 Encounter for screening mammogram for malignant neoplasm of breast: Secondary | ICD-10-CM | POA: Diagnosis not present

## 2022-05-05 ENCOUNTER — Ambulatory Visit: Payer: PPO | Admitting: Internal Medicine

## 2022-05-05 NOTE — Progress Notes (Deleted)
[place] for Infectious Disease  CHIEF COMPLAINT:    Follow up for pulmonary MAC infection    SUBJECTIVE:    Tiffany Franklin is a 76 y.o. female with PMHx as below who presents to the clinic for pulmonary MAC infection.   She is here today for routine follow up.  She was last seen on 11/06/21.  She completed her 3 drug therapy with azithromycin 545m TIW, ethambutol 253mkg TIW, and rifampin 60071mIW at that time after 12 months of negative sputum cultures.  Sputum cultures in February 2023 remained negative. Her last CT chest was in February 2023 and was largely stable compared to her previous one in May 2022.  Today, she reports ***.  Please see A&P for the details of today's visit and status of the patient's medical problems.   Patient's Medications  New Prescriptions   No medications on file  Previous Medications   ACETAMINOPHEN (TYLENOL) 500 MG TABLET    Take 500 mg by mouth every 6 (six) hours as needed (patient took 1/2 pill).   ALBUTEROL (PROVENTIL) (2.5 MG/3ML) 0.083% NEBULIZER SOLUTION    Take 3 mLs (2.5 mg total) by nebulization every 6 (six) hours as needed for wheezing or shortness of breath.   ALBUTEROL (VENTOLIN HFA) 108 (90 BASE) MCG/ACT INHALER    Inhale 2 puffs into the lungs every 6 (six) hours as needed for wheezing or shortness of breath.   ALPRAZOLAM (XANAX) 0.25 MG TABLET    Take 0.25 mg by mouth daily as needed for anxiety.   ARTIFICIAL TEAR SOLUTION (TEARS NATURALE OP)    Place 1-2 drops into both eyes 3 (three) times daily as needed (dry/irritated eyes.).   CYANOCOBALAMIN (,VITAMIN B-12,) 1000 MCG/ML INJECTION    Inject 2,000 mcg into the muscle every 14 (fourteen) days.   MULTIPLE VITAMIN (MULTIVITAMIN WITH MINERALS) TABS TABLET    Take 1 tablet by mouth daily. Women's 50+   VITAMIN D, ERGOCALCIFEROL, (DRISDOL) 1.25 MG (50000 UNIT) CAPS CAPSULE    Take 50,000 Units by mouth 2 (two) times a week.   ZOLPIDEM (AMBIEN) 5 MG TABLET    Take 2.5  mg by mouth at bedtime as needed for sleep.   Modified Medications   No medications on file  Discontinued Medications   No medications on file      Past Medical History:  Diagnosis Date   Allergy    Anxiety    Arthritis    Cataract of both eyes    Occasional tremors    hands bilat    Pneumonia    Wears glasses     Social History   Tobacco Use   Smoking status: Never   Smokeless tobacco: Never  Vaping Use   Vaping Use: Former  Substance Use Topics   Alcohol use: Yes    Comment: occ   Drug use: No    Family History  Problem Relation Age of Onset   Breast cancer Maternal Aunt    Breast cancer Paternal Aunt    Colon cancer Neg Hx    Stomach cancer Neg Hx    Esophageal cancer Neg Hx    Pancreatic cancer Neg Hx     Allergies  Allergen Reactions   Demerol [Meperidine] Nausea And Vomiting and Other (See Comments)    Blood pressure becomes elevated.    ROS    OBJECTIVE:    There were no vitals filed for this visit.  There is no height or  weight on file to calculate BMI.  Physical Exam   Labs and Microbiology:    Latest Ref Rng & Units 08/06/2021   10:18 AM 05/06/2021   10:07 AM 02/03/2021    2:04 PM  CBC  WBC 3.8 - 10.8 Thousand/uL 5.8  4.6  6.6   Hemoglobin 11.7 - 15.5 g/dL 13.4  13.6  13.1   Hematocrit 35.0 - 45.0 % 39.7  40.5  39.2   Platelets 140 - 400 Thousand/uL 225  257  201       Latest Ref Rng & Units 08/06/2021   10:18 AM 05/06/2021   10:07 AM 02/03/2021    2:04 PM  CMP  Glucose 65 - 99 mg/dL 84  77  100   BUN 7 - 25 mg/dL _0 Creatinine 0.60 - 1.00 mg/dL 0.49  0.60  0.71   Sodium 135 - 146 mmol/L 142  142  142   Potassium 3.5 - 5.3 mmol/L 4.2  4.9  4.5   Chloride 98 - 110 mmol/L 106  106  107   CO2 20 - 32 mmol/L _1 Calcium 8.6 - 10.4 mg/dL 9.4  9.5  9.6   Total Protein 6.1 - 8.1 g/dL 6.6  6.6  6.5   Total Bilirubin 0.2 - 1.2 mg/dL 1.1  0.5  1.6   AST 10 - 35 U/L _2 ALT 6 - 29 U/L _3 ASSESSMENT & PLAN:    No problem-specific Assessment & Plan notes found for this encounter.   No orders of the defined types were placed in this encounter.   She is overall doing well having completed MAC therapy in February 2023.  Will repeat sputum cultures today and plan for follow up in about 6 months.   Raynelle [place] for Infectious Disease [facility] 05/05/2022, 4:58 AM  I spent 30 minutes dedicated to the care of this patient on the date of this encounter to include pre-visit review of records, face-to-face time with the patient discussing pulmonary MAC, and post-visit ordering of testing.

## 2022-05-19 ENCOUNTER — Ambulatory Visit (INDEPENDENT_AMBULATORY_CARE_PROVIDER_SITE_OTHER): Payer: PPO | Admitting: Internal Medicine

## 2022-05-19 ENCOUNTER — Encounter: Payer: Self-pay | Admitting: Internal Medicine

## 2022-05-19 ENCOUNTER — Other Ambulatory Visit: Payer: Self-pay

## 2022-05-19 VITALS — BP 153/87 | HR 62 | Temp 97.2°F | Ht 66.0 in | Wt 136.0 lb

## 2022-05-19 DIAGNOSIS — A310 Pulmonary mycobacterial infection: Secondary | ICD-10-CM | POA: Diagnosis not present

## 2022-05-19 NOTE — Assessment & Plan Note (Signed)
She is overall doing well having completed MAC therapy in February 2023.  Will repeat sputum cultures today and plan for follow up in about 6 months.

## 2022-05-19 NOTE — Progress Notes (Signed)
[facility]  CHIEF COMPLAINT:    Follow up for pulmonary MAC infection    SUBJECTIVE:    Tiffany Franklin is a 76 y.o. female with PMHx as below who presents to the clinic for pulmonary MAC infection.   She is here today for routine follow up.  She was last seen on 11/06/21.  She completed her 3 drug therapy with azithromycin 591m TIW, ethambutol 242mkg TIW, and rifampin 60028mIW at that time after 12 months of negative sputum cultures.  Sputum cultures in February 2023 remained negative. Her last CT chest was in February 2023 and was largely stable compared to her previous one in May 2022.  Today, she reports doing okay overall.  She had a "Covid Cough" last spring and then a couple sinus infections since her last visit.  She reports using her inhaler daily and will cough some.  No fevers, chills. .  Please see A&P for the details of today's visit and status of the patient's medical problems.   Patient's Medications  New Prescriptions   No medications on file  Previous Medications   ACETAMINOPHEN (TYLENOL) 500 MG TABLET    Take 500 mg by mouth every 6 (six) hours as needed (patient took 1/2 pill).   ALBUTEROL (PROVENTIL) (2.5 MG/3ML) 0.083% NEBULIZER SOLUTION    Take 3 mLs (2.5 mg total) by nebulization every 6 (six) hours as needed for wheezing or shortness of breath.   ALBUTEROL (VENTOLIN HFA) 108 (90 BASE) MCG/ACT INHALER    Inhale 2 puffs into the lungs every 6 (six) hours as needed for wheezing or shortness of breath.   ALPRAZOLAM (XANAX) 0.25 MG TABLET    Take 0.25 mg by mouth daily as needed for anxiety.   ARTIFICIAL TEAR SOLUTION (TEARS NATURALE OP)    Place 1-2 drops into both eyes 3 (three) times daily as needed (dry/irritated eyes.).   CYANOCOBALAMIN (,VITAMIN B-12,) 1000 MCG/ML INJECTION    Inject 2,000 mcg into the muscle every 14 (fourteen) days.   MULTIPLE VITAMIN (MULTIVITAMIN WITH MINERALS) TABS TABLET    Take 1 tablet by mouth  daily. Women's 50+   VITAMIN D, ERGOCALCIFEROL, (DRISDOL) 1.25 MG (50000 UNIT) CAPS CAPSULE    Take 50,000 Units by mouth 2 (two) times a week.   ZOLPIDEM (AMBIEN) 5 MG TABLET    Take 2.5 mg by mouth at bedtime as needed for sleep.   Modified Medications   No medications on file  Discontinued Medications   No medications on file      Past Medical History:  Diagnosis Date   Allergy    Anxiety    Arthritis    Cataract of both eyes    Occasional tremors    hands bilat    Pneumonia    Wears glasses     Social History   Tobacco Use   Smoking status: Never   Smokeless tobacco: Never  Vaping Use   Vaping Use: Former  Substance Use Topics   Alcohol use: Yes    Comment: occ   Drug use: No    Family History  Problem Relation Age of Onset   Breast cancer Maternal Aunt    Breast cancer Paternal Aunt    Colon cancer Neg Hx    Stomach cancer Neg Hx    Esophageal cancer Neg Hx    Pancreatic cancer Neg Hx     Allergies  Allergen Reactions   Demerol [Meperidine] Nausea And Vomiting  and Other (See Comments)    Blood pressure becomes elevated.    Review of Systems  Constitutional: Negative.   Respiratory:  Positive for cough. Negative for hemoptysis.   Cardiovascular: Negative.   Gastrointestinal: Negative.       OBJECTIVE:    Vitals:   05/19/22 0941  BP: (!) 153/87  Pulse: 62  Temp: (!) 97.2 F (36.2 C)  TempSrc: Oral  Weight: 136 lb (61.7 kg)  Height: _0  (1.676 m)   Body mass index is 21.95 kg/m.  Physical Exam Constitutional:      General: She is not in acute distress.    Appearance: Normal appearance.  HENT:     Head: Normocephalic and atraumatic.  Eyes:     Extraocular Movements: Extraocular movements intact.     Conjunctiva/sclera: Conjunctivae normal.  Pulmonary:     Effort: Pulmonary effort is normal. No respiratory distress.  Abdominal:     General: There is no distension.     Palpations: Abdomen is soft.  Musculoskeletal:         General: Normal range of motion.  Skin:    General: Skin is warm and dry.  Neurological:     General: No focal deficit present.     Mental Status: She is alert and oriented to person, place, and time.  Psychiatric:        Mood and Affect: Mood normal.        Behavior: Behavior normal.      Labs and Microbiology:    Latest Ref Rng & Units 08/06/2021   10:18 AM 05/06/2021   10:07 AM 02/03/2021    2:04 PM  CBC  WBC 3.8 - 10.8 Thousand/uL 5.8  4.6  6.6   Hemoglobin 11.7 - 15.5 g/dL 13.4  13.6  13.1   Hematocrit 35.0 - 45.0 % 39.7  40.5  39.2   Platelets 140 - 400 Thousand/uL 225  257  201       Latest Ref Rng & Units 08/06/2021   10:18 AM 05/06/2021   10:07 AM 02/03/2021    2:04 PM  CMP  Glucose 65 - 99 mg/dL 84  77  100   BUN 7 - 25 mg/dL _1 Creatinine 0.60 - 1.00 mg/dL 0.49  0.60  0.71   Sodium 135 - 146 mmol/L 142  142  142   Potassium 3.5 - 5.3 mmol/L 4.2  4.9  4.5   Chloride 98 - 110 mmol/L 106  106  107   CO2 20 - 32 mmol/L _2 Calcium 8.6 - 10.4 mg/dL 9.4  9.5  9.6   Total Protein 6.1 - 8.1 g/dL 6.6  6.6  6.5   Total Bilirubin 0.2 - 1.2 mg/dL 1.1  0.5  1.6   AST 10 - 35 U/L _3 ALT 6 - 29 U/L _4 ASSESSMENT & PLAN:    Pulmonary mycobacterial infection ([facility]) She is overall doing well having completed MAC therapy in February 2023.  Will repeat sputum cultures today and plan for follow up in about 6 months.    Orders Placed This Encounter  Procedures   MYCOBACTERIA, CULTURE, WITH FLUOROCHROME [facility] 05/19/2022, 10:00 AM  I spent 30 minutes dedicated to the care of this patient on the date of  this encounter to include pre-visit review of records, face-to-face time with the patient discussing pulmonary MAC, and post-visit ordering of testing.

## 2022-05-20 ENCOUNTER — Other Ambulatory Visit: Payer: Self-pay

## 2022-05-20 ENCOUNTER — Other Ambulatory Visit: Payer: PPO

## 2022-05-20 DIAGNOSIS — A310 Pulmonary mycobacterial infection: Secondary | ICD-10-CM | POA: Diagnosis not present

## 2022-05-20 DIAGNOSIS — J479 Bronchiectasis, uncomplicated: Secondary | ICD-10-CM

## 2022-05-20 NOTE — Addendum Note (Signed)
Addended by: Caffie Pinto on: 05/20/2022 09:03 AM   Modules accepted: Orders

## 2022-05-28 ENCOUNTER — Telehealth: Payer: Self-pay

## 2022-05-28 DIAGNOSIS — R0602 Shortness of breath: Secondary | ICD-10-CM | POA: Diagnosis not present

## 2022-05-28 DIAGNOSIS — J479 Bronchiectasis, uncomplicated: Secondary | ICD-10-CM | POA: Diagnosis not present

## 2022-05-28 DIAGNOSIS — R0981 Nasal congestion: Secondary | ICD-10-CM | POA: Diagnosis not present

## 2022-05-28 DIAGNOSIS — R5383 Other fatigue: Secondary | ICD-10-CM | POA: Diagnosis not present

## 2022-05-28 DIAGNOSIS — R058 Other specified cough: Secondary | ICD-10-CM | POA: Diagnosis not present

## 2022-05-28 DIAGNOSIS — A319 Mycobacterial infection, unspecified: Secondary | ICD-10-CM | POA: Diagnosis not present

## 2022-05-28 DIAGNOSIS — R52 Pain, unspecified: Secondary | ICD-10-CM | POA: Diagnosis not present

## 2022-05-28 DIAGNOSIS — Z1152 Encounter for screening for COVID-19: Secondary | ICD-10-CM | POA: Diagnosis not present

## 2022-05-28 DIAGNOSIS — F419 Anxiety disorder, unspecified: Secondary | ICD-10-CM | POA: Diagnosis not present

## 2022-05-28 NOTE — Telephone Encounter (Signed)
Patient called wanting to discuss results of sputum culture. Relayed that results are preliminary and we do not have final report yet.   Beryle Flock, RN

## 2022-06-22 NOTE — Progress Notes (Unsigned)
[facility]  CHIEF COMPLAINT:    Follow up for MAC  SUBJECTIVE:    Tiffany Franklin is a 76 y.o. female with PMHx as below who presents to the clinic for MAC.   Patient has a history of treated pulmonary MAC infection.  She completed 3-drug therapy in February 2023.  I saw her last month and repeated AFB cultures are currently negative.  She had repeat CT chest imaging in February that was largely stable.  She called yesterday requesting an appointment.    She reports today ***.   Please see A&P for the details of today's visit and status of the patient's medical problems.   Patient's Medications  New Prescriptions   No medications on file  Previous Medications   ACETAMINOPHEN (TYLENOL) 500 MG TABLET    Take 500 mg by mouth every 6 (six) hours as needed (patient took 1/2 pill).   ALBUTEROL (PROVENTIL) (2.5 MG/3ML) 0.083% NEBULIZER SOLUTION    Take 3 mLs (2.5 mg total) by nebulization every 6 (six) hours as needed for wheezing or shortness of breath.   ALBUTEROL (VENTOLIN HFA) 108 (90 BASE) MCG/ACT INHALER    Inhale 2 puffs into the lungs every 6 (six) hours as needed for wheezing or shortness of breath.   ALPRAZOLAM (XANAX) 0.25 MG TABLET    Take 0.25 mg by mouth daily as needed for anxiety.   ARTIFICIAL TEAR SOLUTION (TEARS NATURALE OP)    Place 1-2 drops into both eyes 3 (three) times daily as needed (dry/irritated eyes.).   CYANOCOBALAMIN (,VITAMIN B-12,) 1000 MCG/ML INJECTION    Inject 2,000 mcg into the muscle every 14 (fourteen) days.   MULTIPLE VITAMIN (MULTIVITAMIN WITH MINERALS) TABS TABLET    Take 1 tablet by mouth daily. Women's 50+   VITAMIN D, ERGOCALCIFEROL, (DRISDOL) 1.25 MG (50000 UNIT) CAPS CAPSULE    Take 50,000 Units by mouth 2 (two) times a week.   ZOLPIDEM (AMBIEN) 5 MG TABLET    Take 2.5 mg by mouth at bedtime as needed for sleep.   Modified Medications   No medications on file  Discontinued Medications   No medications on  file      Past Medical History:  Diagnosis Date   Allergy    Anxiety    Arthritis    Cataract of both eyes    Occasional tremors    hands bilat    Pneumonia    Wears glasses     Social History   Tobacco Use   Smoking status: Never   Smokeless tobacco: Never  Vaping Use   Vaping Use: Former  Substance Use Topics   Alcohol use: Yes    Comment: occ   Drug use: No    Family History  Problem Relation Age of Onset   Breast cancer Maternal Aunt    Breast cancer Paternal Aunt    Colon cancer Neg Hx    Stomach cancer Neg Hx    Esophageal cancer Neg Hx    Pancreatic cancer Neg Hx     Allergies  Allergen Reactions   Demerol [Meperidine] Nausea And Vomiting and Other (See Comments)    Blood pressure becomes elevated.    ROS   OBJECTIVE:    There were no vitals filed for this visit. There is no height or weight on file to calculate BMI.  Physical Exam   Labs and Microbiology:    Latest Ref Rng & Units 08/06/2021   10:18  AM 05/06/2021   10:07 AM 02/03/2021    2:04 PM  CBC  WBC 3.8 - 10.8 Thousand/uL 5.8  4.6  6.6   Hemoglobin 11.7 - 15.5 g/dL 13.4  13.6  13.1   Hematocrit 35.0 - 45.0 % 39.7  40.5  39.2   Platelets 140 - 400 Thousand/uL 225  257  201       Latest Ref Rng & Units 08/06/2021   10:18 AM 05/06/2021   10:07 AM 02/03/2021    2:04 PM  CMP  Glucose 65 - 99 mg/dL 84  77  100   BUN 7 - 25 mg/dL _0 Creatinine 0.60 - 1.00 mg/dL 0.49  0.60  0.71   Sodium 135 - 146 mmol/L 142  142  142   Potassium 3.5 - 5.3 mmol/L 4.2  4.9  4.5   Chloride 98 - 110 mmol/L 106  106  107   CO2 20 - 32 mmol/L _1 Calcium 8.6 - 10.4 mg/dL 9.4  9.5  9.6   Total Protein 6.1 - 8.1 g/dL 6.6  6.6  6.5   Total Bilirubin 0.2 - 1.2 mg/dL 1.1  0.5  1.6   AST 10 - 35 U/L _2 ALT 6 - 29 U/L _3 No results found for this or any previous visit (from the past 240 hour(s)).  Imaging: ***   ASSESSMENT & PLAN:    No problem-specific Assessment  & Plan notes found for this encounter.   No orders of the defined types were placed in this encounter.    There are no diagnoses linked to this encounter.  ***   Raynelle High[facility] 06/22/2022, 1:16 PM

## 2022-06-23 ENCOUNTER — Other Ambulatory Visit: Payer: Self-pay

## 2022-06-23 ENCOUNTER — Ambulatory Visit (INDEPENDENT_AMBULATORY_CARE_PROVIDER_SITE_OTHER): Payer: PPO | Admitting: Internal Medicine

## 2022-06-23 ENCOUNTER — Encounter: Payer: Self-pay | Admitting: Internal Medicine

## 2022-06-23 DIAGNOSIS — A310 Pulmonary mycobacterial infection: Secondary | ICD-10-CM | POA: Diagnosis not present

## 2022-06-23 NOTE — Assessment & Plan Note (Signed)
Discussed with patient that AFB cultures from last month are still pending and will be finalized at 6 weeks.  At that point, will advise of results.  Reassured her that I do not think her painful dental implant is related to her pulmonary MAC process.

## 2022-06-30 DIAGNOSIS — H04123 Dry eye syndrome of bilateral lacrimal glands: Secondary | ICD-10-CM | POA: Diagnosis not present

## 2022-07-04 LAB — MYCOBACTERIA,CULT W/FLUOROCHROME SMEAR
MICRO NUMBER:: 13791263
SMEAR:: NONE SEEN
SPECIMEN QUALITY:: ADEQUATE

## 2022-08-18 DIAGNOSIS — H04123 Dry eye syndrome of bilateral lacrimal glands: Secondary | ICD-10-CM | POA: Diagnosis not present

## 2022-09-15 DIAGNOSIS — S90212A Contusion of left great toe with damage to nail, initial encounter: Secondary | ICD-10-CM | POA: Diagnosis not present

## 2022-09-17 DIAGNOSIS — R7989 Other specified abnormal findings of blood chemistry: Secondary | ICD-10-CM | POA: Diagnosis not present

## 2022-09-17 DIAGNOSIS — Z79899 Other long term (current) drug therapy: Secondary | ICD-10-CM | POA: Diagnosis not present

## 2022-09-17 DIAGNOSIS — E7800 Pure hypercholesterolemia, unspecified: Secondary | ICD-10-CM | POA: Diagnosis not present

## 2022-09-17 DIAGNOSIS — F419 Anxiety disorder, unspecified: Secondary | ICD-10-CM | POA: Diagnosis not present

## 2022-09-21 DIAGNOSIS — H938X3 Other specified disorders of ear, bilateral: Secondary | ICD-10-CM | POA: Diagnosis not present

## 2022-09-24 DIAGNOSIS — R82998 Other abnormal findings in urine: Secondary | ICD-10-CM | POA: Diagnosis not present

## 2022-09-24 DIAGNOSIS — J479 Bronchiectasis, uncomplicated: Secondary | ICD-10-CM | POA: Diagnosis not present

## 2022-09-24 DIAGNOSIS — Z1212 Encounter for screening for malignant neoplasm of rectum: Secondary | ICD-10-CM | POA: Diagnosis not present

## 2022-09-24 DIAGNOSIS — F419 Anxiety disorder, unspecified: Secondary | ICD-10-CM | POA: Diagnosis not present

## 2022-09-24 DIAGNOSIS — Z0000 Encounter for general adult medical examination without abnormal findings: Secondary | ICD-10-CM | POA: Diagnosis not present

## 2022-09-24 DIAGNOSIS — R140 Abdominal distension (gaseous): Secondary | ICD-10-CM | POA: Diagnosis not present

## 2022-09-24 DIAGNOSIS — Z1389 Encounter for screening for other disorder: Secondary | ICD-10-CM | POA: Diagnosis not present

## 2022-09-24 DIAGNOSIS — N951 Menopausal and female climacteric states: Secondary | ICD-10-CM | POA: Diagnosis not present

## 2022-09-24 DIAGNOSIS — Z1331 Encounter for screening for depression: Secondary | ICD-10-CM | POA: Diagnosis not present

## 2022-09-24 DIAGNOSIS — R748 Abnormal levels of other serum enzymes: Secondary | ICD-10-CM | POA: Diagnosis not present

## 2022-09-24 DIAGNOSIS — E7800 Pure hypercholesterolemia, unspecified: Secondary | ICD-10-CM | POA: Diagnosis not present

## 2022-09-24 DIAGNOSIS — G4700 Insomnia, unspecified: Secondary | ICD-10-CM | POA: Diagnosis not present

## 2022-10-09 DIAGNOSIS — L570 Actinic keratosis: Secondary | ICD-10-CM | POA: Diagnosis not present

## 2022-10-09 DIAGNOSIS — Z85828 Personal history of other malignant neoplasm of skin: Secondary | ICD-10-CM | POA: Diagnosis not present

## 2022-10-12 DIAGNOSIS — Z780 Asymptomatic menopausal state: Secondary | ICD-10-CM | POA: Diagnosis not present

## 2022-10-15 DIAGNOSIS — R399 Unspecified symptoms and signs involving the genitourinary system: Secondary | ICD-10-CM | POA: Diagnosis not present

## 2022-11-16 ENCOUNTER — Ambulatory Visit (INDEPENDENT_AMBULATORY_CARE_PROVIDER_SITE_OTHER): Payer: PPO | Admitting: Internal Medicine

## 2022-11-16 ENCOUNTER — Other Ambulatory Visit: Payer: Self-pay

## 2022-11-16 ENCOUNTER — Encounter: Payer: Self-pay | Admitting: Internal Medicine

## 2022-11-16 VITALS — BP 125/81 | HR 68 | Temp 98.2°F | Wt 136.4 lb

## 2022-11-16 DIAGNOSIS — A310 Pulmonary mycobacterial infection: Secondary | ICD-10-CM

## 2022-11-16 NOTE — Assessment & Plan Note (Signed)
She is overall doing well now after completing MAC therapy about 12 months ago.  Her follow up sputum cultures have been negative, most recently in August 2023.  Will repeat today and for now follow up in 6 months.

## 2022-11-16 NOTE — Progress Notes (Signed)
[facility] Infect[facility] Disease  CHIEF COMPLAINT:    Follow up for MAC  SUBJECTIVE:    Tiffany Franklin is a 77 y.o. female with PMHx as below who presents to the clinic for MAC infection.   Tiffany Franklin has a history of treated pulmonary MAC infection. She completed 3-drug therapy in February 2023. Her sputum AFB has been monitored and was negative in August 2023.  She reports today that she is feeling back to her normal self the past few days but the last couple weeks she had a cough and some shortness of breath that she had to do extra breathing treatments for.  She recently had dental surgery and took amoxicillin for that as well as a sinus infection that she took amoxicillin for.  She had a pre-cancerous lesion removed from her nose recently too.   Please see A&P for the details of today's visit and status of the patient's medical problems.   Patient's Medications  New Prescriptions   No medications on file  Previous Medications   ACETAMINOPHEN (TYLENOL) 500 MG TABLET    Take 500 mg by mouth every 6 (six) hours as needed (patient took 1/2 pill).   ALBUTEROL (PROVENTIL) (2.5 MG/3ML) 0.083% NEBULIZER SOLUTION    Take 3 mLs (2.5 mg total) by nebulization every 6 (six) hours as needed for wheezing or shortness of breath.   ALBUTEROL (VENTOLIN HFA) 108 (90 BASE) MCG/ACT INHALER    Inhale 2 puffs into the lungs every 6 (six) hours as needed for wheezing or shortness of breath.   ALPRAZOLAM (XANAX) 0.25 MG TABLET    Take 0.25 mg by mouth daily as needed for anxiety.   ARTIFICIAL TEAR SOLUTION (TEARS NATURALE OP)    Place 1-2 drops into both eyes 3 (three) times daily as needed (dry/irritated eyes.).   CYANOCOBALAMIN (,VITAMIN B-12,) 1000 MCG/ML INJECTION    Inject 2,000 mcg into the muscle every 14 (fourteen) days.   MULTIPLE VITAMIN (MULTIVITAMIN WITH MINERALS) TABS TABLET    Take 1 tablet by mouth daily. Women's 50+   VITAMIN D, ERGOCALCIFEROL, (DRISDOL) 1.25 MG (50000 UNIT)  CAPS CAPSULE    Take 50,000 Units by mouth 2 (two) times a week.   ZOLPIDEM (AMBIEN) 5 MG TABLET    Take 2.5 mg by mouth at bedtime as needed for sleep.   Modified Medications   No medications on file  Discontinued Medications   No medications on file      Past Medical History:  Diagnosis Date   Allergy    Anxiety    Arthritis    Cataract of both eyes    Occasional tremors    hands bilat    Pneumonia    Wears glasses     Social History   Tobacco Use   Smoking status: Never   Smokeless tobacco: Never  Vaping Use   Vaping Use: Former  Substance Use Topics   Alcohol use: Yes    Comment: occ   Drug use: No    Family History  Problem Relation Age of Onset   Breast cancer Maternal Aunt    Breast cancer Paternal Aunt    Colon cancer Neg Hx    Stomach cancer Neg Hx    Esophageal cancer Neg Hx    Pancreatic cancer Neg Hx     Allergies  Allergen Reactions   Demerol [Meperidine] Nausea And Vomiting and Other (See Comments)    Blood pressure becomes elevated.  Review of Systems  All other systems reviewed and are negative.    OBJECTIVE:    Vitals:   11/16/22 1035  BP: 125/81  Pulse: 68  Temp: 98.2 F (36.8 C)  TempSrc: Oral  SpO2: 97%  Weight: 136 lb 6.4 oz (61.9 kg)   Body mass index is 22.02 kg/m.  Physical Exam Constitutional:      Appearance: Normal appearance.     Comments: She is in good spirits as usual.  Pulmonary:     Effort: Pulmonary effort is normal. No respiratory distress.  Neurological:     General: No focal deficit present.     Mental Status: She is alert and oriented to person, place, and time.  Psychiatric:        Mood and Affect: Mood normal.        Behavior: Behavior normal.      Labs and Microbiology:    Latest Ref Rng & Units 08/06/2021   10:18 AM 05/06/2021   10:07 AM 02/03/2021    2:04 PM  CBC  WBC 3.8 - 10.8 Thousand/uL 5.8  4.6  6.6   Hemoglobin 11.7 - 15.5 g/dL 13.4  13.6  13.1   Hematocrit 35.0 - 45.0 % 39.7   40.5  39.2   Platelets 140 - 400 Thousand/uL 225  257  201       Latest Ref Rng & Units 08/06/2021   10:18 AM 05/06/2021   10:07 AM 02/03/2021    2:04 PM  CMP  Glucose 65 - 99 mg/dL 84  77  100   BUN 7 - 25 mg/dL 11  9  12   $ Creatinine 0.60 - 1.00 mg/dL 0.49  0.60  0.71   Sodium 135 - 146 mmol/L 142  142  142   Potassium 3.5 - 5.3 mmol/L 4.2  4.9  4.5   Chloride 98 - 110 mmol/L 106  106  107   CO2 20 - 32 mmol/L 27  28  27   $ Calcium 8.6 - 10.4 mg/dL 9.4  9.5  9.6   Total Protein 6.1 - 8.1 g/dL 6.6  6.6  6.5   Total Bilirubin 0.2 - 1.2 mg/dL 1.1  0.5  1.6   AST 10 - 35 U/L 15  15  16   $ ALT 6 - 29 U/L 10  10  10       $ ASSESSMENT & PLAN:    Pulmonary mycobacterial infection ([facility]) She is overall doing well now after completing MAC therapy about 12 months ago.  Her follow up sputum cultures have been negative, most recently in August 2023.  Will repeat today and for now follow up in 6 months.    Orders Placed This Encounter  Procedures   MYCOBACTERIA, CULTURE, WITH FLUOROCHROME [facility] 11/16/2022, 10:59 AM

## 2022-11-30 DIAGNOSIS — L570 Actinic keratosis: Secondary | ICD-10-CM | POA: Diagnosis not present

## 2022-11-30 DIAGNOSIS — Z85828 Personal history of other malignant neoplasm of skin: Secondary | ICD-10-CM | POA: Diagnosis not present

## 2022-11-30 DIAGNOSIS — L718 Other rosacea: Secondary | ICD-10-CM | POA: Diagnosis not present

## 2022-12-08 ENCOUNTER — Other Ambulatory Visit: Payer: Self-pay

## 2022-12-08 ENCOUNTER — Encounter (HOSPITAL_BASED_OUTPATIENT_CLINIC_OR_DEPARTMENT_OTHER): Payer: Self-pay | Admitting: Emergency Medicine

## 2022-12-08 ENCOUNTER — Emergency Department (HOSPITAL_BASED_OUTPATIENT_CLINIC_OR_DEPARTMENT_OTHER): Payer: PPO

## 2022-12-08 ENCOUNTER — Emergency Department (HOSPITAL_BASED_OUTPATIENT_CLINIC_OR_DEPARTMENT_OTHER): Payer: PPO | Admitting: Radiology

## 2022-12-08 ENCOUNTER — Other Ambulatory Visit (HOSPITAL_BASED_OUTPATIENT_CLINIC_OR_DEPARTMENT_OTHER): Payer: Self-pay

## 2022-12-08 ENCOUNTER — Emergency Department (HOSPITAL_BASED_OUTPATIENT_CLINIC_OR_DEPARTMENT_OTHER)
Admission: EM | Admit: 2022-12-08 | Discharge: 2022-12-08 | Disposition: A | Discharge status: Home / Self Care | Payer: PPO | Attending: Emergency Medicine | Admitting: Emergency Medicine

## 2022-12-08 DIAGNOSIS — R0789 Other chest pain: Secondary | ICD-10-CM | POA: Diagnosis not present

## 2022-12-08 DIAGNOSIS — J471 Bronchiectasis with (acute) exacerbation: Secondary | ICD-10-CM | POA: Insufficient documentation

## 2022-12-08 DIAGNOSIS — H748X2 Other specified disorders of left middle ear and mastoid: Secondary | ICD-10-CM | POA: Insufficient documentation

## 2022-12-08 DIAGNOSIS — R079 Chest pain, unspecified: Secondary | ICD-10-CM | POA: Diagnosis not present

## 2022-12-08 DIAGNOSIS — J479 Bronchiectasis, uncomplicated: Secondary | ICD-10-CM | POA: Diagnosis not present

## 2022-12-08 DIAGNOSIS — R0602 Shortness of breath: Secondary | ICD-10-CM | POA: Diagnosis not present

## 2022-12-08 LAB — CBC WITH DIFFERENTIAL/PLATELET
Abs Immature Granulocytes: 0.02 10*3/uL (ref 0.00–0.07)
Basophils Absolute: 0 10*3/uL (ref 0.0–0.1)
Basophils Relative: 0 %
Eosinophils Absolute: 0 10*3/uL (ref 0.0–0.5)
Eosinophils Relative: 0 %
HCT: 40.8 % (ref 36.0–46.0)
Hemoglobin: 13.8 g/dL (ref 12.0–15.0)
Immature Granulocytes: 0 %
Lymphocytes Relative: 14 %
Lymphs Abs: 0.7 10*3/uL (ref 0.7–4.0)
MCH: 31.4 pg (ref 26.0–34.0)
MCHC: 33.8 g/dL (ref 30.0–36.0)
MCV: 92.9 fL (ref 80.0–100.0)
Monocytes Absolute: 0.3 10*3/uL (ref 0.1–1.0)
Monocytes Relative: 6 %
Neutro Abs: 4 10*3/uL (ref 1.7–7.7)
Neutrophils Relative %: 80 %
Platelets: 249 10*3/uL (ref 150–400)
RBC: 4.39 MIL/uL (ref 3.87–5.11)
RDW: 12.9 % (ref 11.5–15.5)
WBC: 5.1 10*3/uL (ref 4.0–10.5)
nRBC: 0 % (ref 0.0–0.2)

## 2022-12-08 LAB — COMPREHENSIVE METABOLIC PANEL
ALT: 13 U/L (ref 0–44)
AST: 18 U/L (ref 15–41)
Albumin: 4.3 g/dL (ref 3.5–5.0)
Alkaline Phosphatase: 84 U/L (ref 38–126)
Anion gap: 8 (ref 5–15)
BUN: 12 mg/dL (ref 8–23)
CO2: 27 mmol/L (ref 22–32)
Calcium: 9.9 mg/dL (ref 8.9–10.3)
Chloride: 106 mmol/L (ref 98–111)
Creatinine, Ser: 0.57 mg/dL (ref 0.44–1.00)
GFR, Estimated: >60 mL/min (ref >60)
Glucose, Bld: 68 mg/dL — ABNORMAL LOW (ref 70–99)
Potassium: 4 mmol/L (ref 3.5–5.1)
Sodium: 141 mmol/L (ref 135–145)
Total Bilirubin: 0.5 mg/dL (ref 0.3–1.2)
Total Protein: 7.4 g/dL (ref 6.5–8.1)

## 2022-12-08 LAB — D-DIMER, QUANTITATIVE: D-Dimer, Quant: <0.27 ug/mL-FEU (ref 0.00–0.50)

## 2022-12-08 MED ORDER — IOHEXOL 300 MG/ML  SOLN
100.0000 mL | Freq: Once | INTRAMUSCULAR | Status: AC | PRN
  Administered 2022-12-08: 60 mL via INTRAVENOUS

## 2022-12-08 MED ORDER — AMOXICILLIN-POT CLAVULANATE 875-125 MG PO TABS
1.0000 | ORAL_TABLET | Freq: Two times a day (BID) | ORAL | 0 refills | Status: DC
  Filled 2022-12-08: qty 14, 7d supply, fill #0

## 2022-12-08 NOTE — Discharge Instructions (Addendum)
We are giving you a prescription for an antibiotic called Augmentin today.  You can take that in addition to continuing to use the breathing treatment.  Try calling the pulmonology office to see if they can get you a follow-up appointment.  Since you have seen them for a biopsy in the past they most likely can make you an appointment.  If you are not having any trouble you can talk with Dr. Keane Police office and they should be able to get to a referral.  Also call Dr. Alcario Drought office and tell them you had been in the ER and they had started an antibiotic and they should be able to get you a sooner appointment.

## 2022-12-08 NOTE — ED Notes (Signed)
Pt given crackers and orange juice for low glucose on CMP, verbal order from [facility] MD

## 2022-12-08 NOTE — ED Triage Notes (Signed)
Pt feels shaky and reports SOB and right sided chest pain. She has a cough as well. Pt denies fevers. Hx of being on long term antibiotics for persistent pneumonia. Pt had dental surgery feb 16th and is still reporting pain in jaw.

## 2022-12-08 NOTE — ED Provider Notes (Signed)
[place] Provider Note   CSN: [id number] Arrival date & time: 12/08/22  1210     History  Chief Complaint  Patient presents with   Chest Pain   Shortness of Breath    Tiffany Franklin is a 77 y.o. female.  Pt is a 77y/o female with hx of treated pulmonary MAC infection. She completed 3-drug therapy in February 2023. Her sputum AFB has been monitored and was negative in August 2023 and retested in Feb 2024 with preliminary results being negative who had a dental surgery in February and completed a course of amoxicillin but reports in the last few days she feels like she is needed to use her nebulizer more frequently she is having more cough and has felt intermittently feverish.  She denies any hemoptysis, abdominal pain but does have occasional nausea.  She has had no vomiting but does note some pain in the left side of her chest intermittently.  She also complains of having ongoing jaw pain even after the surgery but feels that it is more in her TMJ area.  She reports after using the breathing treatment this morning her shortness of breath has improved but she reports feeling generally anxious.  No unilateral leg pain or swelling, prior history of PE or DVT.  She does not follow with a pulmonologist or use inhalers other than the nebulizer daily.  She reports if she does not use the nebulizer daily she feels like her cough is worse and she has more shortness of breath.  The history is provided by the patient and medical records.  Chest Pain Associated symptoms: shortness of breath   Shortness of Breath Associated symptoms: chest pain        Home Medications Prior to Admission medications   Medication Sig Start Date End Date Taking? Authorizing Provider  acetaminophen (TYLENOL) 500 MG tablet Take 500 mg by mouth every 6 (six) hours as needed (patient took 1/2 pill).    [provider]  albuterol (PROVENTIL) (2.5 MG/3ML) 0.083%  nebulizer solution Take 3 mLs (2.5 mg total) by nebulization every 6 (six) hours as needed for wheezing or shortness of breath. 11/12/20   Mignon Pine, DO  albuterol (VENTOLIN HFA) 108 (90 Base) MCG/ACT inhaler Inhale 2 puffs into the lungs every 6 (six) hours as needed for wheezing or shortness of breath. 08/08/20   Mannam, Hart Robinsons, MD  ALPRAZolam (XANAX) 0.25 MG tablet Take 0.25 mg by mouth daily as needed for anxiety. 05/10/20   [provider]  Artificial Tear Solution (TEARS NATURALE OP) Place 1-2 drops into both eyes 3 (three) times daily as needed (dry/irritated eyes.).    [provider]  cyanocobalamin (,VITAMIN B-12,) 1000 MCG/ML injection Inject 2,000 mcg into the muscle every 14 (fourteen) days. Patient not taking: Reported on 11/16/2022 04/29/21   [provider]  Multiple Vitamin (MULTIVITAMIN WITH MINERALS) TABS tablet Take 1 tablet by mouth daily. Women's 50+    [provider]  Vitamin D, Ergocalciferol, (DRISDOL) 1.25 MG (50000 UNIT) CAPS capsule Take 50,000 Units by mouth 2 (two) times a week. Patient not taking: Reported on 11/16/2022 04/27/21   [provider]  zolpidem (AMBIEN) 5 MG tablet Take 2.5 mg by mouth at bedtime as needed for sleep.  04/15/15   [provider]      Allergies    Demerol [meperidine]    Review of Systems   Review of Systems  Respiratory:  Positive for shortness of  breath.   Cardiovascular:  Positive for chest pain.    Physical Exam Updated Vital Signs BP (!) 148/77   Pulse 65   Temp 97.9 F (36.6 C) (Oral)   Resp 14   SpO2 98%  Physical Exam Vitals and nursing note reviewed.  Constitutional:      General: She is not in acute distress.    Appearance: She is well-developed.  HENT:     Head: Normocephalic and atraumatic.     Right Ear: Tympanic membrane normal.     Left Ear: A middle ear effusion is present.  Eyes:     Pupils: Pupils are equal, round, and reactive to light.   Cardiovascular:     Rate and Rhythm: Normal rate and regular rhythm.     Pulses: Normal pulses.     Heart sounds: Normal heart sounds. No murmur heard.    No friction rub.  Pulmonary:     Effort: Pulmonary effort is normal.     Breath sounds: Normal breath sounds. No wheezing or rales.  Abdominal:     General: Bowel sounds are normal. There is no distension.     Palpations: Abdomen is soft.     Tenderness: There is no abdominal tenderness. There is no guarding or rebound.  Musculoskeletal:        General: No tenderness. Normal range of motion.     Cervical back: Normal range of motion and neck supple.     Right lower leg: No edema.     Left lower leg: No edema.     Comments: No edema  Skin:    General: Skin is warm and dry.     Findings: No rash.  Neurological:     Mental Status: She is alert and oriented to person, place, and time. Mental status is at baseline.     Cranial Nerves: No cranial nerve deficit.  Psychiatric:        Mood and Affect: Mood normal.        Behavior: Behavior normal.     ED Results / Procedures / Treatments   Labs (all labs ordered are listed, but only abnormal results are displayed) Labs Reviewed  COMPREHENSIVE METABOLIC PANEL - Abnormal; Notable for the following components:      Result Value   Glucose, Bld 68 (*)    All other components within normal limits  CBC WITH DIFFERENTIAL/PLATELET  D-DIMER, QUANTITATIVE    EKG EKG Interpretation  Date/Time:  Tuesday December 08 2022 12:20:24 EST Ventricular Rate:  90 PR Interval:  144 QRS Duration: 70 QT Interval:  352 QTC Calculation: 430 R Axis:   80 Text Interpretation: Normal sinus rhythm Nonspecific ST abnormality When compared with ECG of 19-Jul-2015 11:41, No significant change was found Confirmed by Blanchie Dessert [phone]) on 12/08/2022 12:22:14 PM  Radiology CT Chest W Contrast  Result Date: 12/08/2022 CLINICAL DATA:  Abnormal chest x-ray EXAM: CT CHEST WITH CONTRAST TECHNIQUE:  Multidetector CT imaging of the chest was performed during intravenous contrast administration. RADIATION DOSE REDUCTION: This exam was performed according to the departmental dose-optimization program which includes automated exposure control, adjustment of the mA and/or kV according to patient size and/or use of iterative reconstruction technique. CONTRAST:  80m OMNIPAQUE IOHEXOL 300 MG/ML  SOLN COMPARISON:  Chest x-ray earlier 12/08/2022. Old CT scan 11/12/2021. Older exams as well. FINDINGS: Cardiovascular: No pericardial effusion. The heart is nonenlarged. The thoracic aorta has some scattered mild atherosclerotic plaque. Mediastinum/Nodes: No specific abnormal lymph node enlargement identified in the  axillary region. There are some small hilar nodes which are partially calcified. Is also some mediastinal nodes which are with some calcification. Old granulomatous disease. There are a few small nodes in the mediastinum which are noncalcified. Example which previously was measured at 9 by 17 mm, anterior subcarinal, today on series 3, image 66 measures 18 by 7 mm. Other mediastinal nodes are also stable. Small hiatal hernia. Slightly patulous thoracic esophagus. Question some wall thickening of the esophagus as well. Please correlate with any symptoms. Lungs/Pleura: No pneumothorax or effusion noted bilaterally. Both lungs show areas of bronchiectasis particularly along the middle lobe, lingula greater than the lower lobes with tree-in-bud, reticulonodular changes seen diffusely. Some of the areas of bronchial dilatation have areas of opacity. Area which is increased compared to prior CT scan in the lateral lingula on series 2, image 78. There is also stable medial area along the left upper lobe on series 2 image 70. Other areas of bronchial opacity seen in the left lower lobe, right lower lobe, inferior right upper lobe. There is a calcified nodule right lower lobe on image eighty-three which is stable.  Findings by x-ray and likely related to these reticulonodular changes. Upper Abdomen: Splenic granulomas are seen in the upper abdomen. The adrenal glands are preserved. Musculoskeletal: Mild degenerative changes seen along the spine. IMPRESSION: Once again evidence of chronic infectious bronchiolitis and possible atypical mycobacterial infection with areas of bronchiectasis, tree-in-bud changes. There are several areas of dilated bronchi with luminal opacity. Some of these have increased as above. Additional areas of calcified lung nodule, splenic granulomas and calcified lymph nodes. Slight wall thickening of the esophagus. Please correlate with any symptoms. Aortic Atherosclerosis (ICD10-I70.0). Electronically Signed   By: Jill Side M.D.   On: 12/08/2022 14:44   DG Chest 2 View  Result Date: 12/08/2022 CLINICAL DATA:  Chest pain, shortness of breath EXAM: CHEST - 2 VIEW COMPARISON:  July 18, 2020 FINDINGS: The heart size and mediastinal contours are within normal limits. Left lung is clear. Ill-defined nodular opacity is seen in right infrahilar area. The visualized skeletal structures are unremarkable. IMPRESSION: Ill-defined nodular density seen in right infrahilar region. CT scan of the chest with intravenous contrast is recommended for further evaluation. Electronically Signed   By: Marijo Conception M.D.   On: 12/08/2022 14:07    Procedures Procedures    Medications Ordered in ED Medications  iohexol (OMNIPAQUE) 300 MG/ML solution 100 mL (60 mLs Intravenous Contrast Given 12/08/22 1427)    ED Course/ Medical Decision Making/ A&P                             Medical Decision Making Amount and/or Complexity of Data Reviewed External Data Reviewed: notes.    Details: ID Labs: ordered. Decision-making details documented in ED Course. Radiology: ordered and independent interpretation performed. Decision-making details documented in ED Course. ECG/medicine tests: ordered and independent  interpretation performed. Decision-making details documented in ED Course.  Risk Prescription drug management.   Pt with multiple medical problems and comorbidities and presenting today with a complaint that caries a high risk for morbidity and mortality.  Today with right-sided chest pain and shortness of breath.  This has improved after using a nebulizer but has a history of MAC finished 3 drug treatment last year and has had negative acid-fast sputum since then most recently in February.  Patient does not use any chronic inhalers and only has a rescue  nebulizer that she has to use daily.  Concern for possible worsening infection, PE, reactive airways.  Patient is well-appearing on exam with reassuring vital signs.  O2 sats are 97% on room air and has no wheezing on exam.  I independently interpreted patient's EKG and labs.  EKG without acute findings, CBC, CMP are within normal limits.  D-dimer to further risk stratify patient as she is low risk Wells and d-dimer is neg.  3:01 PM I have independently visualized and interpreted pt's images today.  Chest x-ray with some abnormality in the mid right lung.  This is also where the patient is having pain and they recommended a CT with contrast for further evaluation.  Could be scarring from her prior MAC infection which did appear to be in the right midlung but will reevaluate today.  3:01 PM CT showed findings of bronchiectasis send chronic findings and possible MAC infection.  Spoke with Dr. Juleen China patient's ID provider and at this time he recommends that she can start a short course of Augmentin if her symptoms have acutely worsened and follow-up with them in clinic.  Also will get her plugged in with a pulmonologist as she has not seen them in the past that she may need more chronic respiratory treatments.  Findings discussed with the patient.  She is comfortable with this plan.  No social barriers affecting her discharge today.         Final  Clinical Impression(s) / ED Diagnoses Final diagnoses:  Bronchiectasis with acute exacerbation [facility])    Rx / DC Orders ED Discharge Orders     None         Blanchie Dessert, MD 12/08/22 1514

## 2022-12-08 NOTE — ED Notes (Signed)
RN reviewed discharge instructions w/ pt. Follow up and prescriptions reviewed, pt had no further questions °

## 2022-12-08 NOTE — ED Notes (Signed)
Patient transported to CT 

## 2022-12-21 ENCOUNTER — Encounter: Payer: Self-pay | Admitting: Internal Medicine

## 2022-12-21 ENCOUNTER — Ambulatory Visit: Payer: PPO | Admitting: Internal Medicine

## 2022-12-21 ENCOUNTER — Ambulatory Visit (INDEPENDENT_AMBULATORY_CARE_PROVIDER_SITE_OTHER): Payer: PPO | Admitting: Internal Medicine

## 2022-12-21 ENCOUNTER — Other Ambulatory Visit: Payer: Self-pay

## 2022-12-21 VITALS — BP 132/78 | HR 73 | Temp 97.9°F | Ht 66.0 in | Wt 136.0 lb

## 2022-12-21 DIAGNOSIS — A310 Pulmonary mycobacterial infection: Secondary | ICD-10-CM | POA: Diagnosis not present

## 2022-12-21 NOTE — Progress Notes (Signed)
[facility]  CHIEF COMPLAINT:    Follow up for MAC  SUBJECTIVE:    Tiffany Franklin is a 77 y.o. female with PMHx as below who presents to the clinic for MAC infection.   [name] has a history of treated pulmonary MAC infection. She completed 3-drug therapy in February 2023. Her sputum AFB has been monitored and was negative in August 2023.  She was seen again for follow-up last month and her AFB cultures were repeated and remain negative to date.  She was seen in the emergency department on 12/08/2022.  She presented with feeling of shortness of breath, cough, and needing to use her nebulizer more frequently.  She had a CT scan obtained that shows findings of bronchiectasis with some chronic findings consistent with her known previous MAC infection.  She was given a short course of Augmentin given her acutely worsened symptoms.  She reports this prescription helped.  She was also referred back to pulmonology as she had not seen them for a while to help with her chronic respiratory symptoms and airway clearance.  She reports feeling better following the course of Augmentin.  She reports this being a cyclical pattern of feeling unwell then getting antibiotics and feeling better for a couple weeks prior to feeling unwell again.  She is only using albuterol for inhaler management and otherwise does not do further interventions for airway clearance.  She also reports jaw pain in the setting of prior dental implants.  She reports her dentist has done x rays and finds no problems on imaging.  She thinks the pain improves some with the antibiotics but also that antibiotics in general just make her feel better.      Please see A&P for the details of today's visit and status of the patient's medical problems.   Patient's Medications  New Prescriptions   No medications on file  Previous Medications   ACETAMINOPHEN (TYLENOL) 500 MG TABLET    Take 500 mg by mouth every  6 (six) hours as needed (patient took 1/2 pill).   ALBUTEROL (PROVENTIL) (2.5 MG/3ML) 0.083% NEBULIZER SOLUTION    Take 3 mLs (2.5 mg total) by nebulization every 6 (six) hours as needed for wheezing or shortness of breath.   ALBUTEROL (VENTOLIN HFA) 108 (90 BASE) MCG/ACT INHALER    Inhale 2 puffs into the lungs every 6 (six) hours as needed for wheezing or shortness of breath.   ALPRAZOLAM (XANAX) 0.25 MG TABLET    Take 0.25 mg by mouth daily as needed for anxiety.   ARTIFICIAL TEAR SOLUTION (TEARS NATURALE OP)    Place 1-2 drops into both eyes 3 (three) times daily as needed (dry/irritated eyes.).   CYANOCOBALAMIN (,VITAMIN B-12,) 1000 MCG/ML INJECTION    Inject 2,000 mcg into the muscle every 14 (fourteen) days.   MULTIPLE VITAMIN (MULTIVITAMIN WITH MINERALS) TABS TABLET    Take 1 tablet by mouth daily. Women's 50+   VITAMIN D, ERGOCALCIFEROL, (DRISDOL) 1.25 MG (50000 UNIT) CAPS CAPSULE    Take 50,000 Units by mouth 2 (two) times a week.   ZOLPIDEM (AMBIEN) 5 MG TABLET    Take 2.5 mg by mouth at bedtime as needed for sleep.   Modified Medications   No medications on file  Discontinued Medications   AMOXICILLIN-CLAVULANATE (AUGMENTIN) 875-125 MG TABLET    Take 1 tablet by mouth every 12 (twelve) hours.      Past Medical History:  Diagnosis Date  Allergy    Anxiety    Arthritis    Cataract of both eyes    Occasional tremors    hands bilat    Pneumonia    Wears glasses     Social History   Tobacco Use   Smoking status: Never   Smokeless tobacco: Never  Vaping Use   Vaping Use: Former  Substance Use Topics   Alcohol use: Yes    Comment: occ   Drug use: No    Family History  Problem Relation Age of Onset   Breast cancer Maternal Aunt    Breast cancer Paternal Aunt    Colon cancer Neg Hx    Stomach cancer Neg Hx    Esophageal cancer Neg Hx    Pancreatic cancer Neg Hx     Allergies  Allergen Reactions   Demerol [Meperidine] Nausea And Vomiting and Other (See Comments)     Blood pressure becomes elevated.    Review of Systems  All other systems reviewed and are negative.    OBJECTIVE:    Vitals:   12/21/22 1538  BP: 132/78  Pulse: 73  Temp: 97.9 F (36.6 C)  TempSrc: Temporal  SpO2: 97%  Weight: 136 lb (61.7 kg)  Height: 5\' 6"  (1.676 m)   Body mass index is 21.95 kg/m.  Physical Exam Constitutional:      Appearance: Normal appearance.     Comments: She is in good spirits as usual.  Pulmonary:     Effort: Pulmonary effort is normal. No respiratory distress.  Neurological:     General: No focal deficit present.     Mental Status: She is alert and oriented to person, place, and time.  Psychiatric:        Mood and Affect: Mood normal.        Behavior: Behavior normal.      Labs and Microbiology:    Latest Ref Rng & Units 12/08/2022    1:17 PM 08/06/2021   10:18 AM 05/06/2021   10:07 AM  CBC  WBC 4.0 - 10.5 K/uL 5.1  5.8  4.6   Hemoglobin 12.0 - 15.0 g/dL 13.8  13.4  13.6   Hematocrit 36.0 - 46.0 % 40.8  39.7  40.5   Platelets 150 - 400 K/uL 249  225  257       Latest Ref Rng & Units 12/08/2022    1:17 PM 08/06/2021   10:18 AM 05/06/2021   10:07 AM  CMP  Glucose 70 - 99 mg/dL 68  84  77   BUN 8 - 23 mg/dL 12  11  9    Creatinine 0.44 - 1.00 mg/dL 0.57  0.49  0.60   Sodium 135 - 145 mmol/L 141  142  142   Potassium 3.5 - 5.1 mmol/L 4.0  4.2  4.9   Chloride 98 - 111 mmol/L 106  106  106   CO2 22 - 32 mmol/L 27  27  28    Calcium 8.9 - 10.3 mg/dL 9.9  9.4  9.5   Total Protein 6.5 - 8.1 g/dL 7.4  6.6  6.6   Total Bilirubin 0.3 - 1.2 mg/dL 0.5  1.1  0.5   Alkaline Phos 38 - 126 U/L 84     AST 15 - 41 U/L 18  15  15    ALT 0 - 44 U/L 13  10  10        ASSESSMENT & PLAN:    Pulmonary mycobacterial infection (HCC) Discussed with patient her  prior history and recent AFB cultures that remain no growth.  Suspect that she had a recent bronchiectasis flare that seems improved at this time.  Also, discussed her repeat CT scan on 12/08/22  and chronic MAC process.  We could consider a another bronchoscopy with BAL to obtain AFB cultures, however, given her episodic symptoms I would favor following up with pulmonology first for further management of her chronic respiratory symptoms and any recommendations regarding inhaler therapy or airway clearance would be appreciated.  I also offered referral to the [facility] bronchiectasis clinic and she will think about this as an option and let us know.    No orders of the defined types were placed in this encounter.     Raynelle Highland for Infectious Disease [facility] 12/21/2022, 4:44 PM  I have personally spent 20 minutes involved in face-to-face and non-face-to-face activities for this patient on the day of the visit. Professional time spent includes the following activities: Preparing to see the patient (review of tests), Obtaining and/or reviewing separately obtained history (admission/discharge record), Performing a medically appropriate examination and/or evaluation , Ordering medications/tests/procedures, referring and communicating with other health care professionals, Documenting clinical information in the EMR, Independently interpreting results (not separately reported), Communicating results to the patient/family/caregiver, Counseling and educating the patient/family/caregiver and Care coordination (not separately reported).

## 2022-12-21 NOTE — Assessment & Plan Note (Signed)
Discussed with patient her prior history and recent AFB cultures that remain no growth.  Suspect that she had a recent bronchiectasis flare that seems improved at this time.  Also, discussed her repeat CT scan on 12/08/22 and chronic MAC process.  We could consider a another bronchoscopy with BAL to obtain AFB cultures, however, given her episodic symptoms I would favor following up with pulmonology first for further management of her chronic respiratory symptoms and any recommendations regarding inhaler therapy or airway clearance would be appreciated.  I also offered referral to the [facility] bronchiectasis clinic and she will think about this as an option and let us know.

## 2022-12-23 ENCOUNTER — Encounter: Payer: Self-pay | Admitting: Pulmonary Disease

## 2022-12-23 ENCOUNTER — Ambulatory Visit: Payer: PPO | Admitting: Pulmonary Disease

## 2022-12-23 VITALS — BP 122/64 | HR 80 | Temp 97.9°F | Ht 66.0 in | Wt 136.8 lb

## 2022-12-23 DIAGNOSIS — J479 Bronchiectasis, uncomplicated: Secondary | ICD-10-CM

## 2022-12-23 MED ORDER — SODIUM CHLORIDE 3 % IN NEBU: INHALATION_SOLUTION | Freq: Two times a day (BID) | RESPIRATORY_TRACT | 12 refills | Status: AC

## 2022-12-23 MED ORDER — BREZTRI AEROSPHERE 160-9-4.8 MCG/ACT IN AERO: 2.0000 | INHALATION_SPRAY | Freq: Two times a day (BID) | RESPIRATORY_TRACT | 5 refills | Status: AC

## 2022-12-23 NOTE — Progress Notes (Signed)
Tiffany KIO    NP:[id number]    07-18-1946  Primary Care Physician:Russo, [name], MD  Referring Physician: Shon Baton, [place] [address] [place],  [place] [address]  Chief complaint: Follow-up for recurrent pneumonia, abnormal CT with bronchiectasis  HPI: 77 y.o.  with history of recurrent pneumonias She started pneumonias every couple of years for the past 20 years usually treated with antibiotics Evaluated by Dr. Amedeo Plenty at [facility] in 2012 with PFTs She also had IgE, alpha-1 antitrypsin, quantitative immunoglobulins and CBC at that time which were normal.  She had CT chest x-ray and PFTs after another bout of pneumonia with findings of bronchiectasis and tree-in-bud and has been referred here for further evaluation. She was treated with levofloxacin in August 2021 for pneumonia, frontal sinusitis and started on breztri inhaler. Underwent bronchoscopy on 07/18/2020 with culture showing MAI.  Has been referred to ID and completed 3 drug therapy in February 2023.   Follows with Dr. Earlean Shawl, [name] for colon polyps  Pets: No pets Occupation: Used to work in Scientist, clinical (histocompatibility and immunogenetics), accounting Exposures: No known exposures.  No mold, hot tub, Jacuzzi, no feather pillows Smoking history: Social smoker.  Quit in 1970s Travel history: Originally from [place], [place].  No significant recent travel Relevant family history: No significant family history of lung disease  Interim history: She has not been seen in pulmonary clinic since 2021. Since completing her treatment for MAI in 2023 she has had recurrent attacks of bronchiectasis requiring multiple rounds of antibiotics Continues to have chronic cough, occasional chest congestion.  Seen in the ED on 12/08/2022 with atypical chest pain, shortness of breath which improved with nebulizers.  CT reviewed with mild increase in bronchiectasis.  She was treated with Augmentin  Outpatient Encounter Medications as of 12/23/2022  Medication  Sig   acetaminophen (TYLENOL) 500 MG tablet Take 500 mg by mouth every 6 (six) hours as needed (patient took 1/2 pill).   albuterol (PROVENTIL) (2.5 MG/3ML) 0.083% nebulizer solution Take 3 mLs (2.5 mg total) by nebulization every 6 (six) hours as needed for wheezing or shortness of breath.   albuterol (VENTOLIN HFA) 108 (90 Base) MCG/ACT inhaler Inhale 2 puffs into the lungs every 6 (six) hours as needed for wheezing or shortness of breath.   ALPRAZolam (XANAX) 0.25 MG tablet Take 0.25 mg by mouth daily as needed for anxiety.   Artificial Tear Solution (TEARS NATURALE OP) Place 1-2 drops into both eyes 3 (three) times daily as needed (dry/irritated eyes.).   cyanocobalamin (,VITAMIN B-12,) 1000 MCG/ML injection Inject 2,000 mcg into the muscle every 14 (fourteen) days.   Multiple Vitamin (MULTIVITAMIN WITH MINERALS) TABS tablet Take 1 tablet by mouth daily. Women's 50+   Vitamin D, Ergocalciferol, (DRISDOL) 1.25 MG (50000 UNIT) CAPS capsule Take 50,000 Units by mouth 2 (two) times a week.   zolpidem (AMBIEN) 5 MG tablet Take 2.5 mg by mouth at bedtime as needed for sleep.    Facility-Administered Encounter Medications as of 12/23/2022  Medication   0.9 %  sodium chloride infusion   lactated ringers infusion   Physical Exam: Blood pressure 122/64, pulse 80, temperature 97.9 F (36.6 C), temperature source Oral, height 5\' 6"  (1.676 m), weight 136 lb 12.8 oz (62.1 kg), SpO2 98 %. Gen:      No acute distress HEENT:  EOMI, sclera anicteric Neck:     No masses; no thyromegaly Lungs:    Clear to auscultation bilaterally; normal respiratory effort CV:  Regular rate and rhythm; no murmurs Abd:      + bowel sounds; soft, non-tender; no palpable masses, no distension Ext:    No edema; adequate peripheral perfusion Skin:      Warm and dry; no rash Neuro: alert and oriented x 3 Psych: normal mood and affect   Data Reviewed: Imaging: CT chest 06/28/2020-bronchiectasis in the lingula and right  middle lobe with patchy nodular groundglass opacities with tree-in-bud.  Esophageal wall thickening.  CT chest 12/08/2022-areas of chronic bronchiolitis, bronchiectasis with tree-in-bud.  With mild increase compared to 2023 I have reviewed the images personally  PFTs: 06/08/2020 FVC 2.37 [77%], FEV1 1.70 [73%], F/F 72, TLC 5.48 [102%], DLCO 15. 84 [77%] Minimal obstruction, minimal diffusion defect  Labs: Quantitative immunoglobulins 07/05/2020-normal Alpha-1 antitrypsin 07/05/2020-126, PIMS ANA, CCP, rheumatoid factor 07/05/2020-negative  BAL 07/18/2020 3740 cells, 87% neutrophils Cultures positive for MAI  Assessment:  Bronchiectasis, recurrent pneumonia MAI She finished treatment regimen for MAI in 2023  Has recurrent attacks of bronchiectasis exacerbation though repeat AFB cultures have been negative Start flutter valve, Mucinex Start hypertonic saline nebs twice daily Trial of Breztri inhaler Schedule PFTs  If her exacerbations do not improve then we can consider repeat bronchoscopy with BAL.  GERD Findings of esophageal thickening raises the possibility of GERD, chronic aspiration EGD in 2022 showed normal appearing esophagus  Plan/Recommendations: Flutter valve, Mucinex Hypertonic saline nebs Start Breztri Schedule PFTs  Marshell Garfinkel MD [facility] 12/23/2022, 2:52 PM  CC: Shon Baton, MD

## 2022-12-23 NOTE — Patient Instructions (Signed)
Will start you on an inhaler called Judithann Sauger Will give you a flutter wall in the instructions.  Use this 3 times a day He can use Mucinex over-the-counter Will start you on hypertonic saline nebs twice daily  Schedule PFTs and follow-up in 3 months.

## 2022-12-30 DIAGNOSIS — Z01419 Encounter for gynecological examination (general) (routine) without abnormal findings: Secondary | ICD-10-CM | POA: Diagnosis not present

## 2022-12-30 DIAGNOSIS — Z780 Asymptomatic menopausal state: Secondary | ICD-10-CM | POA: Diagnosis not present

## 2022-12-30 LAB — MYCOBACTERIA,CULT W/FLUOROCHROME SMEAR
MICRO NUMBER:: 14556249
SMEAR:: NONE SEEN
SPECIMEN QUALITY:: ADEQUATE

## 2023-01-07 DIAGNOSIS — Z6821 Body mass index (BMI) 21.0-21.9, adult: Secondary | ICD-10-CM | POA: Diagnosis not present

## 2023-01-07 DIAGNOSIS — A310 Pulmonary mycobacterial infection: Secondary | ICD-10-CM | POA: Diagnosis not present

## 2023-01-20 DIAGNOSIS — A310 Pulmonary mycobacterial infection: Secondary | ICD-10-CM | POA: Diagnosis not present

## 2023-01-20 DIAGNOSIS — J479 Bronchiectasis, uncomplicated: Secondary | ICD-10-CM | POA: Diagnosis not present

## 2023-01-20 DIAGNOSIS — R0981 Nasal congestion: Secondary | ICD-10-CM | POA: Diagnosis not present

## 2023-01-20 DIAGNOSIS — R0602 Shortness of breath: Secondary | ICD-10-CM | POA: Diagnosis not present

## 2023-01-20 DIAGNOSIS — Z87891 Personal history of nicotine dependence: Secondary | ICD-10-CM | POA: Diagnosis not present

## 2023-01-20 DIAGNOSIS — J189 Pneumonia, unspecified organism: Secondary | ICD-10-CM | POA: Diagnosis not present

## 2023-01-20 DIAGNOSIS — J988 Other specified respiratory disorders: Secondary | ICD-10-CM | POA: Diagnosis not present

## 2023-02-25 DIAGNOSIS — L718 Other rosacea: Secondary | ICD-10-CM | POA: Diagnosis not present

## 2023-02-25 DIAGNOSIS — Z85828 Personal history of other malignant neoplasm of skin: Secondary | ICD-10-CM | POA: Diagnosis not present

## 2023-03-25 DIAGNOSIS — H938X3 Other specified disorders of ear, bilateral: Secondary | ICD-10-CM | POA: Diagnosis not present

## 2023-03-25 DIAGNOSIS — H9113 Presbycusis, bilateral: Secondary | ICD-10-CM | POA: Diagnosis not present

## 2023-04-05 DIAGNOSIS — H903 Sensorineural hearing loss, bilateral: Secondary | ICD-10-CM | POA: Diagnosis not present

## 2023-04-14 DIAGNOSIS — J219 Acute bronchiolitis, unspecified: Secondary | ICD-10-CM | POA: Diagnosis not present

## 2023-04-14 DIAGNOSIS — R059 Cough, unspecified: Secondary | ICD-10-CM | POA: Diagnosis not present

## 2023-04-14 DIAGNOSIS — A310 Pulmonary mycobacterial infection: Secondary | ICD-10-CM | POA: Diagnosis not present

## 2023-04-14 DIAGNOSIS — Z6821 Body mass index (BMI) 21.0-21.9, adult: Secondary | ICD-10-CM | POA: Diagnosis not present

## 2023-05-12 ENCOUNTER — Ambulatory Visit: Payer: PPO | Admitting: Internal Medicine

## 2023-05-18 DIAGNOSIS — S90212D Contusion of left great toe with damage to nail, subsequent encounter: Secondary | ICD-10-CM | POA: Diagnosis not present

## 2023-05-18 DIAGNOSIS — L600 Ingrowing nail: Secondary | ICD-10-CM | POA: Diagnosis not present

## 2023-05-18 DIAGNOSIS — B351 Tinea unguium: Secondary | ICD-10-CM | POA: Diagnosis not present

## 2023-05-18 DIAGNOSIS — M79675 Pain in left toe(s): Secondary | ICD-10-CM | POA: Diagnosis not present

## 2023-05-24 DIAGNOSIS — Z7182 Exercise counseling: Secondary | ICD-10-CM | POA: Diagnosis not present

## 2023-05-24 DIAGNOSIS — A310 Pulmonary mycobacterial infection: Secondary | ICD-10-CM | POA: Diagnosis not present

## 2023-05-24 DIAGNOSIS — J479 Bronchiectasis, uncomplicated: Secondary | ICD-10-CM | POA: Diagnosis not present

## 2023-05-24 DIAGNOSIS — R918 Other nonspecific abnormal finding of lung field: Secondary | ICD-10-CM | POA: Diagnosis not present

## 2023-05-24 DIAGNOSIS — R0600 Dyspnea, unspecified: Secondary | ICD-10-CM | POA: Diagnosis not present

## 2023-06-03 DIAGNOSIS — H5201 Hypermetropia, right eye: Secondary | ICD-10-CM | POA: Diagnosis not present

## 2023-06-03 DIAGNOSIS — H04123 Dry eye syndrome of bilateral lacrimal glands: Secondary | ICD-10-CM | POA: Diagnosis not present

## 2023-06-03 DIAGNOSIS — H524 Presbyopia: Secondary | ICD-10-CM | POA: Diagnosis not present

## 2023-07-07 DIAGNOSIS — Z6820 Body mass index (BMI) 20.0-20.9, adult: Secondary | ICD-10-CM | POA: Diagnosis not present

## 2023-07-07 DIAGNOSIS — J069 Acute upper respiratory infection, unspecified: Secondary | ICD-10-CM | POA: Diagnosis not present

## 2023-07-07 DIAGNOSIS — F419 Anxiety disorder, unspecified: Secondary | ICD-10-CM | POA: Diagnosis not present

## 2023-07-07 DIAGNOSIS — R059 Cough, unspecified: Secondary | ICD-10-CM | POA: Diagnosis not present

## 2023-07-07 DIAGNOSIS — Z0001 Encounter for general adult medical examination with abnormal findings: Secondary | ICD-10-CM | POA: Diagnosis not present

## 2023-07-27 DIAGNOSIS — M79675 Pain in left toe(s): Secondary | ICD-10-CM | POA: Diagnosis not present

## 2023-07-27 DIAGNOSIS — L600 Ingrowing nail: Secondary | ICD-10-CM | POA: Diagnosis not present

## 2023-07-27 DIAGNOSIS — B351 Tinea unguium: Secondary | ICD-10-CM | POA: Diagnosis not present

## 2023-07-27 DIAGNOSIS — S90212D Contusion of left great toe with damage to nail, subsequent encounter: Secondary | ICD-10-CM | POA: Diagnosis not present

## 2023-08-27 DIAGNOSIS — I10 Essential (primary) hypertension: Secondary | ICD-10-CM | POA: Diagnosis not present

## 2023-08-27 DIAGNOSIS — R5383 Other fatigue: Secondary | ICD-10-CM | POA: Diagnosis not present

## 2023-08-27 DIAGNOSIS — R739 Hyperglycemia, unspecified: Secondary | ICD-10-CM | POA: Diagnosis not present

## 2023-08-27 DIAGNOSIS — Z13220 Encounter for screening for lipoid disorders: Secondary | ICD-10-CM | POA: Diagnosis not present

## 2023-10-11 DIAGNOSIS — E7800 Pure hypercholesterolemia, unspecified: Secondary | ICD-10-CM | POA: Diagnosis not present

## 2023-10-11 DIAGNOSIS — Z1212 Encounter for screening for malignant neoplasm of rectum: Secondary | ICD-10-CM | POA: Diagnosis not present

## 2023-10-18 DIAGNOSIS — Z1339 Encounter for screening examination for other mental health and behavioral disorders: Secondary | ICD-10-CM | POA: Diagnosis not present

## 2023-10-18 DIAGNOSIS — K1379 Other lesions of oral mucosa: Secondary | ICD-10-CM | POA: Diagnosis not present

## 2023-10-18 DIAGNOSIS — F419 Anxiety disorder, unspecified: Secondary | ICD-10-CM | POA: Diagnosis not present

## 2023-10-18 DIAGNOSIS — R42 Dizziness and giddiness: Secondary | ICD-10-CM | POA: Diagnosis not present

## 2023-10-18 DIAGNOSIS — Z0000 Encounter for general adult medical examination without abnormal findings: Secondary | ICD-10-CM | POA: Diagnosis not present

## 2023-10-18 DIAGNOSIS — R82998 Other abnormal findings in urine: Secondary | ICD-10-CM | POA: Diagnosis not present

## 2023-10-18 DIAGNOSIS — E7800 Pure hypercholesterolemia, unspecified: Secondary | ICD-10-CM | POA: Diagnosis not present

## 2023-10-18 DIAGNOSIS — G4700 Insomnia, unspecified: Secondary | ICD-10-CM | POA: Diagnosis not present

## 2023-10-18 DIAGNOSIS — J479 Bronchiectasis, uncomplicated: Secondary | ICD-10-CM | POA: Diagnosis not present

## 2023-11-04 ENCOUNTER — Other Ambulatory Visit: Payer: Self-pay | Admitting: Internal Medicine

## 2023-11-04 DIAGNOSIS — Z0000 Encounter for general adult medical examination without abnormal findings: Secondary | ICD-10-CM

## 2023-11-11 ENCOUNTER — Ambulatory Visit
Admission: RE | Admit: 2023-11-11 | Discharge: 2023-11-11 | Disposition: A | Discharge status: Home / Self Care | Payer: Medicare HMO | Source: Ambulatory Visit | Attending: Internal Medicine | Admitting: Internal Medicine

## 2023-11-11 DIAGNOSIS — Z0000 Encounter for general adult medical examination without abnormal findings: Secondary | ICD-10-CM

## 2023-11-11 DIAGNOSIS — Z1231 Encounter for screening mammogram for malignant neoplasm of breast: Secondary | ICD-10-CM | POA: Diagnosis not present

## 2023-11-16 ENCOUNTER — Other Ambulatory Visit: Payer: Self-pay | Admitting: Internal Medicine

## 2023-11-16 DIAGNOSIS — R928 Other abnormal and inconclusive findings on diagnostic imaging of breast: Secondary | ICD-10-CM

## 2023-11-18 ENCOUNTER — Ambulatory Visit: Payer: PPO

## 2023-11-18 ENCOUNTER — Ambulatory Visit
Admission: RE | Admit: 2023-11-18 | Discharge: 2023-11-18 | Disposition: A | Discharge status: Home / Self Care | Payer: Medicare HMO | Source: Ambulatory Visit | Attending: Internal Medicine | Admitting: Internal Medicine

## 2023-11-18 DIAGNOSIS — R928 Other abnormal and inconclusive findings on diagnostic imaging of breast: Secondary | ICD-10-CM | POA: Diagnosis not present

## 2023-12-08 DIAGNOSIS — Z7182 Exercise counseling: Secondary | ICD-10-CM | POA: Diagnosis not present

## 2023-12-08 DIAGNOSIS — J479 Bronchiectasis, uncomplicated: Secondary | ICD-10-CM | POA: Diagnosis not present

## 2023-12-08 DIAGNOSIS — R0981 Nasal congestion: Secondary | ICD-10-CM | POA: Diagnosis not present

## 2023-12-08 DIAGNOSIS — A310 Pulmonary mycobacterial infection: Secondary | ICD-10-CM | POA: Diagnosis not present

## 2023-12-08 DIAGNOSIS — R918 Other nonspecific abnormal finding of lung field: Secondary | ICD-10-CM | POA: Diagnosis not present

## 2023-12-08 DIAGNOSIS — R0982 Postnasal drip: Secondary | ICD-10-CM | POA: Diagnosis not present

## 2024-01-13 DIAGNOSIS — Z01419 Encounter for gynecological examination (general) (routine) without abnormal findings: Secondary | ICD-10-CM | POA: Diagnosis not present

## 2024-01-13 DIAGNOSIS — Z8041 Family history of malignant neoplasm of ovary: Secondary | ICD-10-CM | POA: Diagnosis not present

## 2024-01-13 DIAGNOSIS — Z780 Asymptomatic menopausal state: Secondary | ICD-10-CM | POA: Diagnosis not present

## 2024-01-31 DIAGNOSIS — Z8041 Family history of malignant neoplasm of ovary: Secondary | ICD-10-CM | POA: Diagnosis not present

## 2024-01-31 DIAGNOSIS — Z809 Family history of malignant neoplasm, unspecified: Secondary | ICD-10-CM | POA: Diagnosis not present

## 2024-02-23 DIAGNOSIS — D485 Neoplasm of uncertain behavior of skin: Secondary | ICD-10-CM | POA: Diagnosis not present

## 2024-02-23 DIAGNOSIS — Z13820 Encounter for screening for osteoporosis: Secondary | ICD-10-CM | POA: Diagnosis not present

## 2024-02-23 DIAGNOSIS — Z6820 Body mass index (BMI) 20.0-20.9, adult: Secondary | ICD-10-CM | POA: Diagnosis not present

## 2024-02-23 DIAGNOSIS — J479 Bronchiectasis, uncomplicated: Secondary | ICD-10-CM | POA: Diagnosis not present

## 2024-02-23 DIAGNOSIS — F419 Anxiety disorder, unspecified: Secondary | ICD-10-CM | POA: Diagnosis not present

## 2024-02-23 DIAGNOSIS — F339 Major depressive disorder, recurrent, unspecified: Secondary | ICD-10-CM | POA: Diagnosis not present

## 2024-03-11 DIAGNOSIS — J3489 Other specified disorders of nose and nasal sinuses: Secondary | ICD-10-CM | POA: Diagnosis not present

## 2024-03-11 DIAGNOSIS — H00015 Hordeolum externum left lower eyelid: Secondary | ICD-10-CM | POA: Diagnosis not present

## 2024-03-21 ENCOUNTER — Ambulatory Visit: Admitting: Internal Medicine

## 2024-03-21 ENCOUNTER — Encounter: Payer: Self-pay | Admitting: Internal Medicine

## 2024-03-21 VITALS — BP 132/80 | HR 105 | Ht 66.0 in | Wt 127.2 lb

## 2024-03-21 DIAGNOSIS — Z860100 Personal history of colon polyps, unspecified: Secondary | ICD-10-CM

## 2024-03-21 DIAGNOSIS — K648 Other hemorrhoids: Secondary | ICD-10-CM | POA: Diagnosis not present

## 2024-03-21 DIAGNOSIS — Z860101 Personal history of adenomatous and serrated colon polyps: Secondary | ICD-10-CM

## 2024-03-21 DIAGNOSIS — R194 Change in bowel habit: Secondary | ICD-10-CM

## 2024-03-21 DIAGNOSIS — R198 Other specified symptoms and signs involving the digestive system and abdomen: Secondary | ICD-10-CM

## 2024-03-21 DIAGNOSIS — K625 Hemorrhage of anus and rectum: Secondary | ICD-10-CM

## 2024-03-21 MED ORDER — NA SULFATE-K SULFATE-MG SULF 17.5-3.13-1.6 GM/177ML PO SOLN: 1.0000 | Freq: Once | ORAL | 0 refills | Status: AC

## 2024-03-21 MED ORDER — HYDROCORTISONE (PERIANAL) 2.5 % EX CREA: 1.0000 | TOPICAL_CREAM | Freq: Every day | CUTANEOUS | 1 refills | Status: AC

## 2024-03-21 NOTE — Patient Instructions (Signed)
 Take 2 tablespoons of Citrucel daily in water or juice.   We have sent the following medications to your pharmacy for you to pick up at your convenience:  Anusol HC cream.  Purchase Preparation H suppositories over the counter.  Apply a pea sized amount of the Anusol cream to a suppository and insert rectally.  You have been scheduled for a colonoscopy. Please follow written instructions given to you at your visit today.   If you use inhalers (even only as needed), please bring them with you on the day of your procedure.  DO NOT TAKE 7 DAYS PRIOR TO TEST- Trulicity (dulaglutide) Ozempic, Wegovy (semaglutide) Mounjaro (tirzepatide) Bydureon Bcise (exanatide extended release)  DO NOT TAKE 1 DAY PRIOR TO YOUR TEST Rybelsus (semaglutide) Adlyxin (lixisenatide) Victoza (liraglutide) Byetta (exanatide) ___________________________________________________________________________

## 2024-03-21 NOTE — Progress Notes (Signed)
 HISTORY OF PRESENT ILLNESS:  Tiffany Franklin is a 78 y.o. female, acquaintance of Asuncion Blanc, with a history of adenomatous colon polyps (Dr. Andriette Keeling) who presents today with a chief complaint of symptomatic hemorrhoids.  She is referred by her PCP.  The patient was last seen in this office December 2021 regarding mild thickening of the esophagus on CT scan, chronic dyspepsia, and diarrhea.  See that dictation for details.  Upper endoscopy was performed October 24, 2020.  Esophagus was normal.  Her last colonoscopy was performed November 2019.  She was found to have multiple colon polyps.  Follow-up in 5 years recommended.  She tells me that she did undergo hemorrhoidal banding procedure with Dr. Andriette Keeling previously.  She thinks it may have helped.  Not sure when this was performed.  Patient reports to me that she was seen in her gynecology office.  The provider noted that she had large hemorrhoids that she did not even want to touch.  GI evaluation recommended.  Patient tells me that she has occasionally seen blood.  She describes them as uncomfortable.  She wonders about various therapies.  She describes her bowel habits as irregular.  REVIEW OF SYSTEMS:  All non-GI ROS negative unless otherwise stated in the HPI except for fatigue, anxiety, sinus trouble, night sweats  Past Medical History:  Diagnosis Date   Allergy    Anxiety    Arthritis    Cataract of both eyes    Occasional tremors    hands bilat    Pneumonia    Wears glasses     Past Surgical History:  Procedure Laterality Date   ABDOMINAL HYSTERECTOMY     APPENDECTOMY     BRONCHIAL WASHINGS  07/18/2020   Procedure: BRONCHIAL WASHINGS;  Surgeon: Mannam, Praveen, MD;  Location: [facility];  Service: Cardiopulmonary;;   DILATION AND CURETTAGE OF UTERUS     SOFT TISSUE MASS EXCISION Left    TUBAL LIGATION     VIDEO BRONCHOSCOPY  07/18/2020   Procedure: VIDEO BRONCHOSCOPY WITHOUT FLUORO;  Surgeon: Mannam, Praveen, MD;   Location: [facility];  Service: Cardiopulmonary;;    Social History Eulis Hicks  reports that she has never smoked. She has never used smokeless tobacco. She reports current alcohol  use. She reports that she does not use drugs.  family history includes Breast cancer in her maternal aunt and paternal aunt.  Allergies  Allergen Reactions   Demerol [Meperidine] Nausea And Vomiting and Other (See Comments)    Blood pressure becomes elevated.       PHYSICAL EXAMINATION: Vital signs: BP 132/80   Pulse (!) 105   Ht 5' 6 (1.676 m)   Wt 127 lb 3.2 oz (57.7 kg)   BMI 20.53 kg/m   Constitutional: generally well-appearing, no acute distress Psychiatric: alert and oriented x3, cooperative Eyes: extraocular movements intact, anicteric, conjunctiva pink Mouth: oral pharynx moist, no lesions Neck: supple no lymphadenopathy Cardiovascular: heart regular rate and rhythm, no murmur Lungs: clear to auscultation bilaterally with an occasional dry rale Abdomen: soft, nontender, nondistended, no obvious ascites, no peritoneal signs, normal bowel sounds, no organomegaly Rectal: Noninflamed external hemorrhoids.  Large friable prolapsing internal hemorrhoid which is tender Extremities: no lower extremity edema bilaterally Skin: no relevant lesions on visible extremities.  Scar on nose Neuro: No focal deficits.  Cranial nerves intact  ASSESSMENT:  1.  Large prolapsing internal hemorrhoid.  Symptomatic. 2.  History of adenomatous colon polyps.  Overdue for surveillance 3.  General Medical problems.  Stable 4.  Irregular bowel movements  PLAN:  1.  Citrucel 2 tablespoons daily 2.  Anusol HC suppositories at night until colonoscopy 3.  Colonoscopy in 2 months.  This for colorectal neoplasia surveillance and to further evaluate the state of her hemorrhoids to provide the best recommendation for treatment.  Hoping that she may be a candidate for an office banding procedure, but the prolapsing  nature would be to improve.The nature of the procedure, as well as the risks, benefits, and alternatives were carefully and thoroughly reviewed with the patient. Ample time for discussion and questions allowed. The patient understood, was satisfied, and agreed to proceed. 4.  In office banding procedure.  Discussed.  Literature/brochure provided for her review. A total time of 45 minutes was spent preparing to see the patient, obtaining comprehensive history, performing medically appropriate physical exam, counseling educating the patient regarding the above listed issues, ordering medications, ordering endoscopic procedure, and documenting clinical information in the health record

## 2024-03-23 ENCOUNTER — Encounter: Payer: Self-pay | Admitting: Internal Medicine

## 2024-03-24 DIAGNOSIS — H00015 Hordeolum externum left lower eyelid: Secondary | ICD-10-CM | POA: Diagnosis not present

## 2024-05-08 ENCOUNTER — Encounter: Admitting: Internal Medicine

## 2024-05-12 DIAGNOSIS — H0015 Chalazion left lower eyelid: Secondary | ICD-10-CM | POA: Diagnosis not present

## 2024-05-12 DIAGNOSIS — Z961 Presence of intraocular lens: Secondary | ICD-10-CM | POA: Diagnosis not present

## 2024-05-17 ENCOUNTER — Encounter: Payer: Self-pay | Admitting: Internal Medicine

## 2024-05-25 ENCOUNTER — Encounter: Payer: Self-pay | Admitting: Internal Medicine

## 2024-05-25 ENCOUNTER — Ambulatory Visit: Admitting: Internal Medicine

## 2024-05-25 VITALS — BP 104/57 | HR 61 | Temp 98.7°F | Resp 20 | Ht 66.0 in | Wt 127.0 lb

## 2024-05-25 DIAGNOSIS — Z860101 Personal history of adenomatous and serrated colon polyps: Secondary | ICD-10-CM

## 2024-05-25 DIAGNOSIS — K5730 Diverticulosis of large intestine without perforation or abscess without bleeding: Secondary | ICD-10-CM | POA: Diagnosis not present

## 2024-05-25 DIAGNOSIS — Z860100 Personal history of colon polyps, unspecified: Secondary | ICD-10-CM

## 2024-05-25 DIAGNOSIS — Z1211 Encounter for screening for malignant neoplasm of colon: Secondary | ICD-10-CM

## 2024-05-25 DIAGNOSIS — K648 Other hemorrhoids: Secondary | ICD-10-CM

## 2024-05-25 DIAGNOSIS — D123 Benign neoplasm of transverse colon: Secondary | ICD-10-CM | POA: Diagnosis not present

## 2024-05-25 DIAGNOSIS — F419 Anxiety disorder, unspecified: Secondary | ICD-10-CM | POA: Diagnosis not present

## 2024-05-25 DIAGNOSIS — K635 Polyp of colon: Secondary | ICD-10-CM | POA: Diagnosis not present

## 2024-05-25 MED ORDER — SODIUM CHLORIDE 0.9 % IV SOLN: 500.0000 mL | Freq: Once | INTRAVENOUS | Status: DC

## 2024-05-25 NOTE — Patient Instructions (Addendum)
 Resume previous diet Continue present medications Await pathology results Contact office to schedule hemorrhoid banding if interested   Handouts/information given for polyps, hemorrhoids, hemorrhoid ligation(banding) and diverticulosis  YOU HAD AN ENDOSCOPIC PROCEDURE TODAY AT THE [facility]:   Refer to the procedure report that was given to you for any specific questions about what was found during the examination.  If the procedure report does not answer your questions, please call your gastroenterologist to clarify.  If you requested that your care partner not be given the details of your procedure findings, then the procedure report has been included in a sealed envelope for you to review at your convenience later.  YOU SHOULD EXPECT: Some feelings of bloating in the abdomen. Passage of more gas than usual.  Walking can help get rid of the air that was put into your GI tract during the procedure and reduce the bloating. If you had a lower endoscopy (such as a colonoscopy or flexible sigmoidoscopy) you may notice spotting of blood in your stool or on the toilet paper. If you underwent a bowel prep for your procedure, you may not have a normal bowel movement for a few days.  Please Note:  You might notice some irritation and congestion in your nose or some drainage.  This is from the oxygen used during your procedure.  There is no need for concern and it should clear up in a day or so.  SYMPTOMS TO REPORT IMMEDIATELY:  Following lower endoscopy (colonoscopy):  Excessive amounts of blood in the stool  Significant tenderness or worsening of abdominal pains  Swelling of the abdomen that is new, acute  Fever of 100F or higher For urgent or emergent issues, a gastroenterologist can be reached at any hour by calling [phone]. Do not use MyChart messaging for urgent concerns.   DIET:  We do recommend a small meal at first, but then you may proceed to your regular diet.  Drink  plenty of fluids but you should avoid alcoholic beverages for 24 hours.  ACTIVITY:  You should plan to take it easy for the rest of today and you should NOT DRIVE or use heavy machinery until tomorrow (because of the sedation medicines used during the test).    FOLLOW UP: Our staff will call the number listed on your records the next business day following your procedure.  We will call around 7:15- 8:00 am to check on you and address any questions or concerns that you may have regarding the information given to you following your procedure. If we do not reach you, we will leave a message.     If any biopsies were taken you will be contacted by phone or by letter within the next 1-3 weeks.  Please call us  at [phone] if you have not heard about the biopsies in 3 weeks.   SIGNATURES/CONFIDENTIALITY: You and/or your care partner have signed paperwork which will be entered into your electronic medical record.  These signatures attest to the fact that that the information above on your After Visit Summary has been reviewed and is understood.  Full responsibility of the confidentiality of this discharge information lies with you and/or your care-partner.

## 2024-05-25 NOTE — Progress Notes (Signed)
 Sedate, gd SR, tolerated procedure well, VSS, report to RN

## 2024-05-25 NOTE — Progress Notes (Signed)
 Called to room to assist during endoscopic procedure.  Patient ID and intended procedure confirmed with present staff. Received instructions for my participation in the procedure from the performing physician.

## 2024-05-25 NOTE — Op Note (Signed)
 [facility] Patient Name: Tiffany Franklin Procedure Date: 05/25/2024 7:46 AM MRN: [id number] Endoscopist: Norleen SAILOR. Abran , MD, [id number] Age: 78 Referring MD:  Date of Birth: 1946-09-14 Gender: Female Account #: [id number] Procedure:                Colonoscopy with cold snare polypectomy x 1 Indications:              High risk colon cancer surveillance: Personal                            history of multiple (3 or more) adenomas. Most                            recent exam 2019 with Dr. Luis Medicines:                Monitored Anesthesia Care Procedure:                Pre-Anesthesia Assessment:                           - Prior to the procedure, a History and Physical                            was performed, and patient medications and                            allergies were reviewed. The patient's tolerance of                            previous anesthesia was also reviewed. The risks                            and benefits of the procedure and the sedation                            options and risks were discussed with the patient.                            All questions were answered, and informed consent                            was obtained. Prior Anticoagulants: The patient has                            taken no anticoagulant or antiplatelet agents. ASA                            Grade Assessment: II - A patient with mild systemic                            disease. After reviewing the risks and benefits,                            the patient was deemed in satisfactory condition to  undergo the procedure.                           After obtaining informed consent, the colonoscope                            was passed under direct vision. Throughout the                            procedure, the patient's blood pressure, pulse, and                            oxygen saturations were monitored continuously. The                             Olympus CF-HQ190L ([id number]) Colonoscope was                            introduced through the anus and advanced to the the                            cecum, identified by appendiceal orifice and                            ileocecal valve. The ileocecal valve, appendiceal                            orifice, and rectum were photographed. The quality                            of the bowel preparation was excellent. The                            colonoscopy was performed without difficulty. The                            patient tolerated the procedure well. The bowel                            preparation used was SUPREP via split dose                            instruction. Scope In: 8:52:06 AM Scope Out: 9:09:23 AM Scope Withdrawal Time: 0 hours 14 minutes 3 seconds  Total Procedure Duration: 0 hours 17 minutes 17 seconds  Findings:                 A 2 mm polyp was found in the transverse colon. The                            polyp was removed with a cold snare. Resection and                            retrieval were complete.  Multiple diverticula were found in the entire colon.                           Internal hemorrhoids were found during                            retroflexion. The hemorrhoids were moderate.                           The exam was otherwise without abnormality on                            direct and retroflexion views. Complications:            No immediate complications. Estimated blood loss:                            None. Estimated Blood Loss:     Estimated blood loss: none. Impression:               - One 2 mm polyp in the transverse colon, removed                            with a cold snare. Resected and retrieved.                           - Diverticulosis in the entire examined colon.                           - Internal hemorrhoids.                           - The examination was otherwise normal on direct                             and retroflexion views. Recommendation:           - Repeat colonoscopy is not recommended for                            surveillance.                           - Patient has a contact number available for                            emergencies. The signs and symptoms of potential                            delayed complications were discussed with the                            patient. Return to normal activities tomorrow.                            Written discharge instructions were provided to the  patient.                           - Resume previous diet.                           - Continue present medications.                           - Await pathology results.                           - Continue fiber and medicated suppositories for                            hemorrhoids                           - At this point, he would be a candidate for an                            office hemorrhoidal banding procedure if desired.                            If so, please reach out and I will refer you to one                            of my partners who performs this procedure Norleen SAILOR. Abran, MD 05/25/2024 9:30:58 AM This report has been signed electronically.

## 2024-05-25 NOTE — Progress Notes (Signed)
 Expand All Collapse All HISTORY OF PRESENT ILLNESS:   Tiffany Franklin is a 78 y.o. female, acquaintance of Charlie Riedel, with a history of adenomatous colon polyps (Dr. Luis) who presents today with a chief complaint of symptomatic hemorrhoids.  She is referred by her PCP.   The patient was last seen in this office December 2021 regarding mild thickening of the esophagus on CT scan, chronic dyspepsia, and diarrhea.  See that dictation for details.  Upper endoscopy was performed October 24, 2020.  Esophagus was normal.  Her last colonoscopy was performed November 2019.  She was found to have multiple colon polyps.  Follow-up in 5 years recommended.  She tells me that she did undergo hemorrhoidal banding procedure with Dr. Luis previously.  She thinks it may have helped.  Not sure when this was performed.   Patient reports to me that she was seen in her gynecology office.  The provider noted that she had large hemorrhoids that she did not even want to touch.  GI evaluation recommended.  Patient tells me that she has occasionally seen blood.  She describes them as uncomfortable.  She wonders about various therapies.  She describes her bowel habits as irregular.   REVIEW OF SYSTEMS:   All non-GI ROS negative unless otherwise stated in the HPI except for fatigue, anxiety, sinus trouble, night sweats       Past Medical History:  Diagnosis Date   Allergy     Anxiety     Arthritis     Cataract of both eyes     Occasional tremors      hands bilat    Pneumonia     Wears glasses                 Past Surgical History:  Procedure Laterality Date   ABDOMINAL HYSTERECTOMY       APPENDECTOMY       BRONCHIAL WASHINGS   07/18/2020    Procedure: BRONCHIAL WASHINGS;  Surgeon: Mannam, Praveen, MD;  Location: [facility];  Service: Cardiopulmonary;;   DILATION AND CURETTAGE OF UTERUS       SOFT TISSUE MASS EXCISION Left     TUBAL LIGATION       VIDEO BRONCHOSCOPY   07/18/2020    Procedure:  VIDEO BRONCHOSCOPY WITHOUT FLUORO;  Surgeon: Mannam, Praveen, MD;  Location: [facility];  Service: Cardiopulmonary;;          Social History Rock JAYSON Caprice  reports that she has never smoked. She has never used smokeless tobacco. She reports current alcohol  use. She reports that she does not use drugs.   family history includes Breast cancer in her maternal aunt and paternal aunt.   Allergies       Allergies  Allergen Reactions   Demerol [Meperidine] Nausea And Vomiting and Other (See Comments)      Blood pressure becomes elevated.            PHYSICAL EXAMINATION: Vital signs: BP 132/80   Pulse (!) 105   Ht 5' 6 (1.676 m)   Wt 127 lb 3.2 oz (57.7 kg)   BMI 20.53 kg/m   Constitutional: generally well-appearing, no acute distress Psychiatric: alert and oriented x3, cooperative Eyes: extraocular movements intact, anicteric, conjunctiva pink Mouth: oral pharynx moist, no lesions Neck: supple no lymphadenopathy Cardiovascular: heart regular rate and rhythm, no murmur Lungs: clear to auscultation bilaterally with an occasional dry rale Abdomen: soft, nontender, nondistended, no obvious ascites, no peritoneal signs, normal bowel sounds,  no organomegaly Rectal: Noninflamed external hemorrhoids.  Large friable prolapsing internal hemorrhoid which is tender Extremities: no lower extremity edema bilaterally Skin: no relevant lesions on visible extremities.  Scar on nose Neuro: No focal deficits.  Cranial nerves intact   ASSESSMENT:   1.  Large prolapsing internal hemorrhoid.  Symptomatic. 2.  History of adenomatous colon polyps.  Overdue for surveillance 3.  General Medical problems.  Stable 4.  Irregular bowel movements   PLAN:   1.  Citrucel 2 tablespoons daily 2.  Anusol  HC suppositories at night until colonoscopy 3.  Colonoscopy in 2 months.  This for colorectal neoplasia surveillance and to further evaluate the state of her hemorrhoids to provide the best  recommendation for treatment.  Hoping that she may be a candidate for an office banding procedure, but the prolapsing nature would be to improve.The nature of the procedure, as well as the risks, benefits, and alternatives were carefully and thoroughly reviewed with the patient. Ample time for discussion and questions allowed. The patient understood, was satisfied, and agreed to proceed. 4.  In office banding procedure.  Discussed.  Literature/brochure provided for her review.

## 2024-05-26 ENCOUNTER — Telehealth: Payer: Self-pay

## 2024-05-26 NOTE — Telephone Encounter (Signed)
 Left message

## 2024-05-30 ENCOUNTER — Ambulatory Visit: Payer: Self-pay | Admitting: Internal Medicine

## 2024-05-30 LAB — SURGICAL PATHOLOGY

## 2024-06-01 DIAGNOSIS — Z85828 Personal history of other malignant neoplasm of skin: Secondary | ICD-10-CM | POA: Diagnosis not present

## 2024-06-01 DIAGNOSIS — L570 Actinic keratosis: Secondary | ICD-10-CM | POA: Diagnosis not present

## 2024-06-01 DIAGNOSIS — C44529 Squamous cell carcinoma of skin of other part of trunk: Secondary | ICD-10-CM | POA: Diagnosis not present

## 2024-06-01 DIAGNOSIS — D485 Neoplasm of uncertain behavior of skin: Secondary | ICD-10-CM | POA: Diagnosis not present

## 2024-06-01 DIAGNOSIS — C44519 Basal cell carcinoma of skin of other part of trunk: Secondary | ICD-10-CM | POA: Diagnosis not present

## 2024-06-06 DIAGNOSIS — Z961 Presence of intraocular lens: Secondary | ICD-10-CM | POA: Diagnosis not present

## 2024-06-06 DIAGNOSIS — H04123 Dry eye syndrome of bilateral lacrimal glands: Secondary | ICD-10-CM | POA: Diagnosis not present

## 2024-06-07 DIAGNOSIS — A310 Pulmonary mycobacterial infection: Secondary | ICD-10-CM | POA: Diagnosis not present

## 2024-06-07 DIAGNOSIS — R918 Other nonspecific abnormal finding of lung field: Secondary | ICD-10-CM | POA: Diagnosis not present

## 2024-06-07 DIAGNOSIS — R0981 Nasal congestion: Secondary | ICD-10-CM | POA: Diagnosis not present

## 2024-06-07 DIAGNOSIS — J479 Bronchiectasis, uncomplicated: Secondary | ICD-10-CM | POA: Diagnosis not present

## 2024-09-12 ENCOUNTER — Institutional Professional Consult (permissible substitution) (INDEPENDENT_AMBULATORY_CARE_PROVIDER_SITE_OTHER): Admitting: Otolaryngology

## 2024-09-20 ENCOUNTER — Encounter (INDEPENDENT_AMBULATORY_CARE_PROVIDER_SITE_OTHER): Payer: Self-pay
